# Patient Record
Sex: Female | Born: 1945 | Race: White | Hispanic: No | Marital: Married | Smoking: Former smoker
Health system: Southern US, Community
[De-identification: ages and names within clinical notes are randomized; demographics above are authoritative.]

## PROBLEM LIST (undated history)

## (undated) DIAGNOSIS — R87619 Unspecified abnormal cytological findings in specimens from cervix uteri: Secondary | ICD-10-CM

## (undated) DIAGNOSIS — E785 Hyperlipidemia, unspecified: Secondary | ICD-10-CM

## (undated) DIAGNOSIS — Z8619 Personal history of other infectious and parasitic diseases: Secondary | ICD-10-CM

## (undated) DIAGNOSIS — R002 Palpitations: Secondary | ICD-10-CM

## (undated) HISTORY — DX: Palpitations: R00.2

## (undated) HISTORY — PX: EYE SURGERY: SHX253

## (undated) HISTORY — DX: Hyperlipidemia, unspecified: E78.5

## (undated) HISTORY — PX: TONSILLECTOMY: SHX5217

## (undated) HISTORY — DX: Personal history of other infectious and parasitic diseases: Z86.19

## (undated) HISTORY — PX: PARATHYROID EXPLORATION: SHX732

## (undated) HISTORY — PX: CERVICAL BIOPSY  W/ LOOP ELECTRODE EXCISION: SUR135

## (undated) HISTORY — DX: Unspecified abnormal cytological findings in specimens from cervix uteri: R87.619

---

## 1995-07-12 HISTORY — PX: HERNIA REPAIR: SHX51

## 1999-05-31 ENCOUNTER — Encounter: Admission: RE | Admit: 1999-05-31 | Discharge: 1999-05-31 | Payer: Self-pay | Admitting: *Deleted

## 1999-07-13 ENCOUNTER — Other Ambulatory Visit: Admission: RE | Admit: 1999-07-13 | Discharge: 1999-07-13 | Payer: Self-pay | Admitting: *Deleted

## 2000-05-31 ENCOUNTER — Encounter: Admission: RE | Admit: 2000-05-31 | Discharge: 2000-05-31 | Payer: Self-pay | Admitting: *Deleted

## 2000-07-20 ENCOUNTER — Other Ambulatory Visit: Admission: RE | Admit: 2000-07-20 | Discharge: 2000-07-20 | Payer: Self-pay | Admitting: *Deleted

## 2001-06-01 ENCOUNTER — Encounter: Admission: RE | Admit: 2001-06-01 | Discharge: 2001-06-01 | Payer: Self-pay | Admitting: *Deleted

## 2001-08-02 ENCOUNTER — Other Ambulatory Visit: Admission: RE | Admit: 2001-08-02 | Discharge: 2001-08-02 | Payer: Self-pay | Admitting: *Deleted

## 2002-06-03 ENCOUNTER — Encounter: Admission: RE | Admit: 2002-06-03 | Discharge: 2002-06-03 | Payer: Self-pay | Admitting: *Deleted

## 2002-06-03 ENCOUNTER — Encounter: Payer: Self-pay | Admitting: *Deleted

## 2002-08-13 ENCOUNTER — Other Ambulatory Visit: Admission: RE | Admit: 2002-08-13 | Discharge: 2002-08-13 | Payer: Self-pay | Admitting: *Deleted

## 2003-06-06 ENCOUNTER — Encounter: Admission: RE | Admit: 2003-06-06 | Discharge: 2003-06-06 | Payer: Self-pay | Admitting: *Deleted

## 2003-08-07 ENCOUNTER — Other Ambulatory Visit: Admission: RE | Admit: 2003-08-07 | Discharge: 2003-08-07 | Payer: Self-pay | Admitting: *Deleted

## 2004-05-07 ENCOUNTER — Encounter: Admission: RE | Admit: 2004-05-07 | Discharge: 2004-05-07 | Payer: Self-pay | Admitting: *Deleted

## 2005-05-06 ENCOUNTER — Encounter: Admission: RE | Admit: 2005-05-06 | Discharge: 2005-05-06 | Payer: Self-pay | Admitting: *Deleted

## 2006-05-05 ENCOUNTER — Encounter: Admission: RE | Admit: 2006-05-05 | Discharge: 2006-05-05 | Payer: Self-pay | Admitting: *Deleted

## 2007-05-08 ENCOUNTER — Encounter: Admission: RE | Admit: 2007-05-08 | Discharge: 2007-05-08 | Payer: Self-pay | Admitting: Obstetrics and Gynecology

## 2008-05-07 ENCOUNTER — Encounter: Admission: RE | Admit: 2008-05-07 | Discharge: 2008-05-07 | Payer: Self-pay | Admitting: Obstetrics and Gynecology

## 2009-04-28 ENCOUNTER — Ambulatory Visit: Payer: Self-pay | Admitting: Diagnostic Radiology

## 2009-04-28 ENCOUNTER — Ambulatory Visit (HOSPITAL_BASED_OUTPATIENT_CLINIC_OR_DEPARTMENT_OTHER): Admission: RE | Admit: 2009-04-28 | Discharge: 2009-04-28 | Payer: Self-pay | Admitting: Family Medicine

## 2010-05-04 ENCOUNTER — Ambulatory Visit: Payer: Self-pay | Admitting: Radiology

## 2010-05-04 ENCOUNTER — Ambulatory Visit (HOSPITAL_BASED_OUTPATIENT_CLINIC_OR_DEPARTMENT_OTHER): Admission: RE | Admit: 2010-05-04 | Discharge: 2010-05-04 | Payer: Self-pay | Admitting: Obstetrics and Gynecology

## 2010-08-01 ENCOUNTER — Encounter: Payer: Self-pay | Admitting: Family Medicine

## 2011-03-10 ENCOUNTER — Other Ambulatory Visit (HOSPITAL_BASED_OUTPATIENT_CLINIC_OR_DEPARTMENT_OTHER): Payer: Self-pay | Admitting: Obstetrics & Gynecology

## 2011-03-10 DIAGNOSIS — Z1231 Encounter for screening mammogram for malignant neoplasm of breast: Secondary | ICD-10-CM

## 2011-05-05 ENCOUNTER — Telehealth: Payer: Self-pay | Admitting: Internal Medicine

## 2011-05-05 ENCOUNTER — Ambulatory Visit (HOSPITAL_BASED_OUTPATIENT_CLINIC_OR_DEPARTMENT_OTHER)
Admission: RE | Admit: 2011-05-05 | Discharge: 2011-05-05 | Disposition: A | Discharge status: Home / Self Care | Payer: Federal, State, Local not specified - PPO | Source: Ambulatory Visit | Attending: Obstetrics & Gynecology | Admitting: Obstetrics & Gynecology

## 2011-05-05 DIAGNOSIS — Z1231 Encounter for screening mammogram for malignant neoplasm of breast: Secondary | ICD-10-CM | POA: Insufficient documentation

## 2011-05-05 NOTE — Telephone Encounter (Signed)
Stephanie Turner is someone that is looking to becoming a new patient of Dr. Constance Goltz. She is not due to have an pap until January and would like to have that done in January. She has questions on what all do Dr, Constance Goltz does on her first visit and if she can just wait possibility to get everything done in January (I think I understood her correctly I could be wrong on this one). I advise her that since she have a lot of questions that Dr Lonell Face nurse Gavin Pound would give her a call and break it down better to her of what she does on her first and second visits. Then she can decide what she would like to do from there. Her house number is  [phone] and her cell [phone]. Nalani works Monday thru Fri 8- 130 pm; its better to call after 130 pm.   Thanks so much in advance for calling Mckala back

## 2011-05-09 NOTE — Telephone Encounter (Signed)
LMOVM for pt to return call to the office

## 2011-05-11 NOTE — Telephone Encounter (Signed)
Spoke with patient.  Answered questions, scheduled both establish care appointment and CPE appointment

## 2011-05-11 NOTE — Telephone Encounter (Signed)
Pt is calling again stating she have not recd a call; she was inform that she was called and a message was left; she wants to ask questions and she wants to know about results of a mammogram; please call at home [phone]

## 2011-07-13 ENCOUNTER — Telehealth: Payer: Self-pay | Admitting: Internal Medicine

## 2011-07-13 NOTE — Telephone Encounter (Signed)
Pt have questions on what her blood work is actually testing ; and possible other questions; please call [phone], if you are not able to get on the land line; thanks

## 2011-07-14 NOTE — Telephone Encounter (Signed)
LMOVM for pt to return call to the office

## 2011-07-19 NOTE — Telephone Encounter (Signed)
Spoke with [name], answered questions regarding visit and potential labs

## 2011-07-20 ENCOUNTER — Other Ambulatory Visit: Payer: Self-pay | Admitting: Internal Medicine

## 2011-07-20 ENCOUNTER — Ambulatory Visit (INDEPENDENT_AMBULATORY_CARE_PROVIDER_SITE_OTHER): Payer: Federal, State, Local not specified - PPO | Admitting: Internal Medicine

## 2011-07-20 ENCOUNTER — Encounter: Payer: Self-pay | Admitting: Internal Medicine

## 2011-07-20 VITALS — BP 136/70 | HR 71 | Temp 97.6°F | Resp 16 | Ht 59.75 in | Wt 200.0 lb

## 2011-07-20 DIAGNOSIS — M810 Age-related osteoporosis without current pathological fracture: Secondary | ICD-10-CM | POA: Insufficient documentation

## 2011-07-20 DIAGNOSIS — Z113 Encounter for screening for infections with a predominantly sexual mode of transmission: Secondary | ICD-10-CM

## 2011-07-20 DIAGNOSIS — R002 Palpitations: Secondary | ICD-10-CM | POA: Insufficient documentation

## 2011-07-20 DIAGNOSIS — R87619 Unspecified abnormal cytological findings in specimens from cervix uteri: Secondary | ICD-10-CM | POA: Insufficient documentation

## 2011-07-20 DIAGNOSIS — E213 Hyperparathyroidism, unspecified: Secondary | ICD-10-CM | POA: Insufficient documentation

## 2011-07-20 DIAGNOSIS — Z139 Encounter for screening, unspecified: Secondary | ICD-10-CM

## 2011-07-20 DIAGNOSIS — Z01419 Encounter for gynecological examination (general) (routine) without abnormal findings: Secondary | ICD-10-CM

## 2011-07-20 DIAGNOSIS — H269 Unspecified cataract: Secondary | ICD-10-CM | POA: Insufficient documentation

## 2011-07-20 DIAGNOSIS — Z1272 Encounter for screening for malignant neoplasm of vagina: Secondary | ICD-10-CM

## 2011-07-20 LAB — POCT URINALYSIS DIPSTICK
Blood, UA: NEGATIVE
Glucose, UA: NEGATIVE
Nitrite, UA: NEGATIVE
Protein, UA: NEGATIVE
Urobilinogen, UA: NEGATIVE

## 2011-07-20 NOTE — Progress Notes (Signed)
Subjective:    Patient ID: Stephanie Turner, female    DOB: Apr 22, 1946, 66 y.o.   MRN: [id number]  HPI New pt here for first visit and comprehensive exam.  Former care Dr. Rosalio Macadamia and has seen an endocrine surgeon in past.  PMH of palpitations, Hypercalcemia due to hyperparathyroidism per pt report,  Osteoporosis formerly on Fosamax now off, post menopausal   And cataracts.  I have no prior records.  She overall is doing well.  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  No breast, uterine, ovarian or colon CA in family  No Known Allergies Past Medical History  Diagnosis Date  . Palpitations   . Abnormal cervical Papanicolaou smear   . Hyperparathyroidism   . Cataract   . Osteoporosis    Past Surgical History  Procedure Date  . Hernia repair 1997   History   Social History  . Marital Status: Married    Spouse Name: N/A    Number of Children: N/A  . Years of Education: N/A   Occupational History  . Not on file.   Social History Main Topics  . Smoking status: Former Smoker    Quit date: 07/11/1984  . Smokeless tobacco: Never Used  . Alcohol Use: No  . Drug Use: No  . Sexually Active: Yes    Birth Control/ Protection: Post-menopausal   Other Topics Concern  . Not on file   Social History Narrative  . No narrative on file   Family History  Problem Relation Age of Onset  . Cancer Mother     leukemia   Patient Active Problem List  Diagnoses  . Abnormal cervical Papanicolaou smear  . Palpitations  . Hyperparathyroidism  . Cataract  . Osteoporosis  . Hypercalcemia   No current outpatient prescriptions on file prior to visit.       Review of Systems Review of all  9 body system is negative      Objective:   Physical Exam  Physical Exam  Vital signs and nursing note  reviewed  Constitutional: She is oriented to person, place, and time. She appears well-developed and well-nourished. She is cooperative.  HENT:  Head: Normocephalic and atraumatic.  Right Ear: Tympanic membrane normal.  Left Ear: Tympanic membrane normal.  Nose: Nose normal.  Mouth/Throat: Oropharynx is clear and moist and mucous membranes are normal. No oropharyngeal exudate or posterior oropharyngeal erythema.  Eyes: Conjunctivae and EOM are normal. Pupils are equal, round, and reactive to light.  Neck: Neck supple. No JVD present. Carotid bruit is not present. No mass and no thyromegaly present.  Cardiovascular: Regular rhythm, normal heart sounds, intact distal pulses and normal pulses.  Exam reveals no gallop and no friction rub.   No murmur heard. Pulses:      Dorsalis pedis pulses are 2+ on the right side, and 2+ on the left side.  Pulmonary/Chest: Breath sounds normal. She has no wheezes. She has no rhonchi. She has no rales. Right breast exhibits no mass, no nipple discharge and no skin change. Left breast exhibits no mass, no nipple discharge and no skin change.  Abdominal: Soft. Bowel sounds are normal. She exhibits no distension and no mass. There is no hepatosplenomegaly. There is no tenderness. There is no CVA tenderness.  Genitourinary: Rectum normal, vagina normal and uterus normal.  Rectal exam shows no mass. Guaiac negative stool. No labial fusion. There is no lesion on the right labia. There is no lesion on the left labia. Cervix exhibits no motion tenderness. Right adnexum displays no mass, no tenderness and no fullness. Left adnexum displays no mass, no tenderness and no fullness. No erythema around the vagina.  Musculoskeletal:       No active synovitis to any joint.    Lymphadenopathy:       Right cervical: No superficial cervical adenopathy present.      Left cervical: No superficial cervical adenopathy present.       Right axillary: No pectoral and no lateral adenopathy  present.       Left axillary: No pectoral and no lateral adenopathy present.      Right: No inguinal adenopathy present.       Left: No inguinal adenopathy present.  Neurological: She is alert and oriented to person, place, and time. She has normal strength and normal reflexes. No cranial nerve deficit or sensory deficit. She displays a negative Romberg sign. Coordination and gait normal.  Skin: Skin is warm and dry. No abrasion, no bruising, no ecchymosis and no rash noted. No cyanosis. Nails show no clubbing.  Psychiatric: She has a normal mood and affect. Her speech is normal and behavior is normal.          Assessment & Plan:  1)  palpitaitons  Well controlled with Atenolol   Will need old records  Check chemistries , lipids 2)  Past osteoporosis  Off Bisphosphanates now  Will schedule bone density  3)  Hypercalcemia and hyperparathyroidism S/P  Parathyroidectomy  All per pt report will need old records  Check calcium TSH today  See scanned record   I spent 45 minutes with this pt.  Pap done today        Assessment & Plan:

## 2011-07-20 NOTE — Patient Instructions (Addendum)
Labs will be mailed to you  See me in 6 months or as needed   I recommend a colonsocopy  We will schedule a bone density

## 2011-07-21 ENCOUNTER — Encounter: Payer: Self-pay | Admitting: Emergency Medicine

## 2011-07-21 LAB — CBC WITH DIFFERENTIAL/PLATELET
Basophils Absolute: 0 10*3/uL (ref 0.0–0.1)
Eosinophils Absolute: 0.1 10*3/uL (ref 0.0–0.7)
Eosinophils Relative: 2 % (ref 0–5)
Lymphs Abs: 2.8 10*3/uL (ref 0.7–4.0)
MCH: 29.7 pg (ref 26.0–34.0)
Neutrophils Relative %: 49 % (ref 43–77)
Platelets: 239 10*3/uL (ref 150–400)
RBC: 4.68 MIL/uL (ref 3.87–5.11)
RDW: 14.6 % (ref 11.5–15.5)
WBC: 6.8 10*3/uL (ref 4.0–10.5)

## 2011-07-21 LAB — LIPID PANEL
Cholesterol: 207 mg/dL — ABNORMAL HIGH (ref 0–200)
LDL Cholesterol: 113 mg/dL — ABNORMAL HIGH (ref 0–99)
Total CHOL/HDL Ratio: 2.8 Ratio
VLDL: 21 mg/dL (ref 0–40)

## 2011-07-21 LAB — COMPREHENSIVE METABOLIC PANEL
ALT: 17 U/L (ref 0–35)
AST: 21 U/L (ref 0–37)
Alkaline Phosphatase: 95 U/L (ref 39–117)
CO2: 24 mEq/L (ref 19–32)
Creat: 0.81 mg/dL (ref 0.50–1.10)
Sodium: 140 mEq/L (ref 135–145)
Total Bilirubin: 0.5 mg/dL (ref 0.3–1.2)
Total Protein: 7.3 g/dL (ref 6.0–8.3)

## 2011-07-21 LAB — VITAMIN D 25 HYDROXY (VIT D DEFICIENCY, FRACTURES): Vit D, 25-Hydroxy: 39 ng/mL (ref 30–89)

## 2011-07-21 LAB — TSH: TSH: 1.063 u[IU]/mL (ref 0.350–4.500)

## 2011-07-27 ENCOUNTER — Telehealth: Payer: Self-pay | Admitting: Internal Medicine

## 2011-07-27 NOTE — Telephone Encounter (Signed)
Spoke with [name].  Reviewed labs, explained she will receive a copy in the mail.

## 2011-07-27 NOTE — Telephone Encounter (Signed)
Pt states she have not receive a call about blood work results. Please call her at [phone] ,and if you have call her in the morning (830am -130pm) call her cell number. Thanks

## 2011-07-28 ENCOUNTER — Telehealth: Payer: Self-pay | Admitting: Emergency Medicine

## 2011-07-28 ENCOUNTER — Encounter: Payer: Self-pay | Admitting: Internal Medicine

## 2011-07-28 DIAGNOSIS — D351 Benign neoplasm of parathyroid gland: Secondary | ICD-10-CM | POA: Insufficient documentation

## 2011-07-28 NOTE — Telephone Encounter (Signed)
Received her prior records with normal paps in past.,  Can have every two years

## 2011-07-28 NOTE — Telephone Encounter (Signed)
Spoke with Stephanie Turner.  She would like to know at what age she can begin to have pap smears less often than every year?  She was told by her Ob/gyn previously that at 45, she could have one done every other year or even every 2 years.  Your recommendation on her result was for a repeat in a year.

## 2011-08-01 ENCOUNTER — Ambulatory Visit: Payer: Federal, State, Local not specified - PPO | Admitting: Internal Medicine

## 2011-08-01 ENCOUNTER — Other Ambulatory Visit: Payer: Self-pay | Admitting: Internal Medicine

## 2011-08-01 NOTE — Telephone Encounter (Signed)
Aware pap needed every 2 years.  She also mentioned that she had bone density done this morning and would like me to remind you that she was on Fosamax for years which was discontinued by Dr. Corrinne Eagle 3-4 years ago after an improvement in her bone density.  She would like a letter reminding her when she is due again for bone density and what her bone density showed mailed to her for her own record keeping.

## 2011-08-01 NOTE — Telephone Encounter (Signed)
Left message on voice mail for Lasya to call back at her convenience

## 2011-08-02 ENCOUNTER — Inpatient Hospital Stay: Admission: RE | Admit: 2011-08-02 | Payer: Federal, State, Local not specified - PPO | Source: Ambulatory Visit

## 2011-08-02 NOTE — Telephone Encounter (Signed)
Ok to mail a copy of results to pt.    Call her and let her know that when coming off Fosamax , standard care is to check a bone density yearly when off the medication, however Medicare may only pay for her test every two years.   She should check with Medicare to see if it is paid for once a year when she has had osteoporosis in past and is now off her medication.

## 2011-08-03 NOTE — Telephone Encounter (Signed)
Left message with Cortnee's husband for her to call the office

## 2011-08-03 NOTE — Telephone Encounter (Signed)
Stephanie Turner aware of results, will check with her insurance carrier to see what her coverage is with history of osteoporosis.  Mailed a copy of results to her home address

## 2011-08-05 ENCOUNTER — Encounter: Payer: Self-pay | Admitting: Internal Medicine

## 2011-08-10 ENCOUNTER — Telehealth: Payer: Self-pay | Admitting: Internal Medicine

## 2011-08-10 NOTE — Telephone Encounter (Signed)
Pt would like a return call; she needs to discuss her bone density test with someone. Please call her at [phone]. Thanks

## 2011-08-10 NOTE — Telephone Encounter (Signed)
Let pt know that I looked at her last bone density  she has very minimal change from her prior one  and her bone density is very good for her age.  She is to take her calcium and Vitamin D daily and do weight bearing exercise.

## 2011-08-10 NOTE — Telephone Encounter (Signed)
Spoke with Stephanie Turner.  She would like to know if [facility] compared her bone density scan to her last bone density she had done.  She would also like a detailed explanation.  For instance, she knows the scan was "good", but does that mean "good for my age or what?"

## 2011-08-11 NOTE — Telephone Encounter (Signed)
Left message with pt's husband for her to return call to the office

## 2011-08-22 ENCOUNTER — Other Ambulatory Visit: Payer: Self-pay | Admitting: Emergency Medicine

## 2011-08-22 NOTE — Telephone Encounter (Signed)
Vida called, requested refill of Atenolol, 90 day supply, to be sent to [facility]

## 2011-08-22 NOTE — Telephone Encounter (Signed)
Pt aware.

## 2011-08-23 MED ORDER — ATENOLOL 50 MG PO TABS: 50.0000 mg | ORAL_TABLET | Freq: Every day | ORAL | Status: DC

## 2011-08-26 ENCOUNTER — Encounter: Payer: Self-pay | Admitting: Internal Medicine

## 2012-02-15 ENCOUNTER — Other Ambulatory Visit: Payer: Self-pay | Admitting: Internal Medicine

## 2012-02-15 DIAGNOSIS — Z1231 Encounter for screening mammogram for malignant neoplasm of breast: Secondary | ICD-10-CM

## 2012-05-07 ENCOUNTER — Ambulatory Visit (HOSPITAL_BASED_OUTPATIENT_CLINIC_OR_DEPARTMENT_OTHER): Payer: Federal, State, Local not specified - PPO

## 2012-05-07 ENCOUNTER — Ambulatory Visit (HOSPITAL_BASED_OUTPATIENT_CLINIC_OR_DEPARTMENT_OTHER)
Admission: RE | Admit: 2012-05-07 | Discharge: 2012-05-07 | Disposition: A | Discharge status: Home / Self Care | Payer: Federal, State, Local not specified - PPO | Source: Ambulatory Visit | Attending: Internal Medicine | Admitting: Internal Medicine

## 2012-05-07 DIAGNOSIS — Z1231 Encounter for screening mammogram for malignant neoplasm of breast: Secondary | ICD-10-CM | POA: Insufficient documentation

## 2012-05-08 ENCOUNTER — Ambulatory Visit (HOSPITAL_BASED_OUTPATIENT_CLINIC_OR_DEPARTMENT_OTHER): Payer: Federal, State, Local not specified - PPO

## 2012-05-17 ENCOUNTER — Telehealth: Payer: Self-pay | Admitting: Internal Medicine

## 2012-05-17 NOTE — Telephone Encounter (Signed)
Pt will like to know if an fax came over about her mammogram... She would like to know more info about BILATERAL mammogram next year.. Please call pt at [phone].... Thanks

## 2012-05-22 NOTE — Telephone Encounter (Signed)
Left message awaiting return call

## 2012-05-30 ENCOUNTER — Other Ambulatory Visit: Payer: Self-pay | Admitting: *Deleted

## 2012-05-30 NOTE — Telephone Encounter (Signed)
Needs refill

## 2012-05-31 MED ORDER — ATENOLOL 50 MG PO TABS: 50.0000 mg | ORAL_TABLET | Freq: Every day | ORAL | Status: DC

## 2012-07-12 ENCOUNTER — Telehealth: Payer: Self-pay | Admitting: *Deleted

## 2012-07-12 DIAGNOSIS — Z0000 Encounter for general adult medical examination without abnormal findings: Secondary | ICD-10-CM

## 2012-07-12 NOTE — Telephone Encounter (Signed)
Called regarding lab experience left message

## 2012-07-20 ENCOUNTER — Telehealth: Payer: Self-pay | Admitting: *Deleted

## 2012-07-20 DIAGNOSIS — Z139 Encounter for screening, unspecified: Secondary | ICD-10-CM

## 2012-07-20 LAB — CBC WITH DIFFERENTIAL/PLATELET
Basophils Absolute: 0 10*3/uL (ref 0.0–0.1)
Basophils Relative: 0 % (ref 0–1)
HCT: 43.1 % (ref 36.0–46.0)
Lymphocytes Relative: 38 % (ref 12–46)
MCHC: 33.4 g/dL (ref 30.0–36.0)
Monocytes Absolute: 0.5 10*3/uL (ref 0.1–1.0)
Neutro Abs: 3.3 10*3/uL (ref 1.7–7.7)
Neutrophils Relative %: 51 % (ref 43–77)
Platelets: 221 10*3/uL (ref 150–400)
RDW: 14 % (ref 11.5–15.5)
WBC: 6.5 10*3/uL (ref 4.0–10.5)

## 2012-07-20 LAB — COMPREHENSIVE METABOLIC PANEL WITH GFR
ALT: 17 U/L (ref 0–35)
AST: 18 U/L (ref 0–37)
Albumin: 4.4 g/dL (ref 3.5–5.2)
Alkaline Phosphatase: 74 U/L (ref 39–117)
BUN: 16 mg/dL (ref 6–23)
CO2: 27 meq/L (ref 19–32)
Calcium: 9.4 mg/dL (ref 8.4–10.5)
Chloride: 104 meq/L (ref 96–112)
Creat: 0.73 mg/dL (ref 0.50–1.10)
Glucose, Bld: 92 mg/dL (ref 70–99)
Potassium: 4.6 meq/L (ref 3.5–5.3)
Sodium: 140 meq/L (ref 135–145)
Total Bilirubin: 0.7 mg/dL (ref 0.3–1.2)
Total Protein: 6.7 g/dL (ref 6.0–8.3)

## 2012-07-20 LAB — LIPID PANEL
Cholesterol: 188 mg/dL (ref 0–200)
HDL: 65 mg/dL
LDL Cholesterol: 104 mg/dL — ABNORMAL HIGH (ref 0–99)
Total CHOL/HDL Ratio: 2.9 ratio
Triglycerides: 95 mg/dL
VLDL: 19 mg/dL (ref 0–40)

## 2012-07-20 LAB — TSH: TSH: 1.438 u[IU]/mL (ref 0.350–4.500)

## 2012-07-23 ENCOUNTER — Ambulatory Visit (INDEPENDENT_AMBULATORY_CARE_PROVIDER_SITE_OTHER): Payer: Federal, State, Local not specified - PPO | Admitting: Internal Medicine

## 2012-07-23 ENCOUNTER — Encounter: Payer: Self-pay | Admitting: Internal Medicine

## 2012-07-23 ENCOUNTER — Telehealth: Payer: Self-pay | Admitting: Internal Medicine

## 2012-07-23 ENCOUNTER — Other Ambulatory Visit: Payer: Self-pay | Admitting: Internal Medicine

## 2012-07-23 VITALS — BP 138/88 | HR 76 | Temp 97.3°F | Resp 18 | Wt 196.1 lb

## 2012-07-23 DIAGNOSIS — Z139 Encounter for screening, unspecified: Secondary | ICD-10-CM

## 2012-07-23 DIAGNOSIS — R002 Palpitations: Secondary | ICD-10-CM

## 2012-07-23 DIAGNOSIS — D351 Benign neoplasm of parathyroid gland: Secondary | ICD-10-CM

## 2012-07-23 DIAGNOSIS — Z1151 Encounter for screening for human papillomavirus (HPV): Secondary | ICD-10-CM

## 2012-07-23 DIAGNOSIS — M810 Age-related osteoporosis without current pathological fracture: Secondary | ICD-10-CM

## 2012-07-23 DIAGNOSIS — R87619 Unspecified abnormal cytological findings in specimens from cervix uteri: Secondary | ICD-10-CM

## 2012-07-23 LAB — POCT URINALYSIS DIPSTICK
Blood, UA: NEGATIVE
Nitrite, UA: NEGATIVE
Protein, UA: NEGATIVE
Spec Grav, UA: >=1.03
Urobilinogen, UA: NEGATIVE

## 2012-07-23 LAB — HEMOCCULT GUIAC POC 1CARD (OFFICE)

## 2012-07-23 MED ORDER — ATENOLOL 50 MG PO TABS: 50.0000 mg | ORAL_TABLET | Freq: Every day | ORAL | Status: DC

## 2012-07-23 NOTE — Addendum Note (Signed)
Addended by: Mathews Robinsons on: 07/23/2012 12:29 PM   Modules accepted: Orders

## 2012-07-23 NOTE — Progress Notes (Signed)
Subjective:    Patient ID: Stephanie Turner, female    DOB: 1946-01-13, 67 y.o.   MRN: [id number]  HPI  Stephanie Turner is here for CPE.  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  She now cares for her 2 grandchildren.   HTN:  She is on Tenormin daily and tolerating well.    Osteoporosis:  Last dexa done 07/2011 and normal.  She is on Calcium and Vitamin D  HIstory of cervical dysplasia  Last pap normal.  She is S/P cryosurgery   No Known Allergies Past Medical History  Diagnosis Date  . Palpitations   . Abnormal cervical Papanicolaou smear   . Hyperparathyroidism   . Cataract   . Osteoporosis    Past Surgical History  Procedure Date  . Hernia repair 1997   History   Social History  . Marital Status: Married    Spouse Name: N/A    Number of Children: N/A  . Years of Education: N/A   Occupational History  . Not on file.   Social History Main Topics  . Smoking status: Former Smoker    Quit date: 07/11/1984  . Smokeless tobacco: Never Used  . Alcohol Use: No  . Drug Use: No  . Sexually Active: Yes    Birth Control/ Protection: Post-menopausal   Other Topics Concern  . Not on file   Social History Narrative  . No narrative on file   Family History  Problem Relation Age of Onset  . Cancer Mother     leukemia   Patient Active Problem List  Diagnosis  . Abnormal cervical Papanicolaou smear  . Palpitations  . Hyperparathyroidism  . Cataract  . Osteoporosis  . Hypercalcemia  . Parathyroid adenoma   Current Outpatient Prescriptions on File Prior to Visit  Medication Sig Dispense Refill  . atenolol (TENORMIN) 50 MG tablet Take 1 tablet (50 mg total) by mouth daily.  90 tablet  3  . fish oil-omega-3 fatty acids 1000 MG capsule Take 1 capsule by mouth daily.      Marland Kitchen GARLIC PO Take 1 tablet by mouth daily.      . Multiple Vitamin (MULTIVITAMIN) tablet Take 1 tablet by mouth daily.      Marland Kitchen ROYAL JELLY PO Take 1 tablet by mouth daily.          Review of Systems See  HPI    Objective:   Physical Exam Physical Exam  Vital signs and nursing note reviewed  Constitutional: She is oriented to person, place, and time. She appears well-developed and well-nourished. She is cooperative.  HENT:  Head: Normocephalic and atraumatic.  Right Ear: Tympanic membrane normal.  Left Ear: Tympanic membrane normal.  Nose: Nose normal.  Mouth/Throat: Oropharynx is clear and moist and mucous membranes are normal. No oropharyngeal exudate or posterior oropharyngeal erythema.  Eyes: Conjunctivae and EOM are normal. Pupils are equal, round, and reactive to light.  Neck: Neck supple. No JVD present. Carotid bruit is not present. No mass and no thyromegaly present.  Cardiovascular: Regular rhythm, normal heart sounds, intact distal pulses and normal pulses.  Exam reveals no gallop and no friction rub.   No murmur heard. Pulses:      Dorsalis pedis pulses are 2+ on the right side, and 2+ on the left side.  Pulmonary/Chest: Breath sounds normal. She has no wheezes. She has no rhonchi. She has no rales. Right breast exhibits no mass, no nipple discharge and no skin change. Left breast exhibits no mass,  no nipple discharge and no skin change.  Abdominal: Soft. Bowel sounds are normal. She exhibits no distension and no mass. There is no hepatosplenomegaly. There is no tenderness. There is no CVA tenderness.  Genitourinary: Rectum normal, vagina normal and uterus normal. Rectal exam shows no mass. Guaiac negative stool. No labial fusion. There is no lesion on the right labia. There is no lesion on the left labia. Cervix exhibits no motion tenderness. Right adnexum displays no mass, no tenderness and no fullness. Left adnexum displays no mass, no tenderness and no fullness. No erythema around the vagina.  Musculoskeletal:       No active synovitis to any joint.    Lymphadenopathy:       Right cervical: No superficial cervical adenopathy present.      Left cervical: No superficial  cervical adenopathy present.       Right axillary: No pectoral and no lateral adenopathy present.       Left axillary: No pectoral and no lateral adenopathy present.      Right: No inguinal adenopathy present.       Left: No inguinal adenopathy present.  Neurological: She is alert and oriented to person, place, and time. She has normal strength and normal reflexes. No cranial nerve deficit or sensory deficit. She displays a negative Romberg sign. Coordination and gait normal.  Skin: Skin is warm and dry. No abrasion, no bruising, no ecchymosis and no rash noted. No cyanosis. Nails show no clubbing.  Psychiatric: She has a normal mood and affect. Her speech is normal and behavior is normal.          Assessment & Plan:  Health Maintenance  :  See scanned sheet. She is going to check her [employer] records as she believes she is UTD with tetanus.  Pap today.  She repeatedly denies colonoscopy despite my advice it is necessary  History of cervical dysplasia  Palpitations  On Tenormin  History of osteoporosis  2013 dexa normal  On calcium and vitamin D  S/P parathyroidectomy calcium normal   See me as needed        Assessment & Plan:

## 2012-07-23 NOTE — Telephone Encounter (Signed)
Left message to return call 

## 2012-07-23 NOTE — Addendum Note (Signed)
Addended by: Mathews Robinsons on: 07/23/2012 12:32 PM   Modules accepted: Orders

## 2012-07-23 NOTE — Telephone Encounter (Signed)
Pt called and request to have a call back from you .Marland Kitchen Please call pt at [phone]

## 2012-07-26 NOTE — Telephone Encounter (Signed)
Pt will come in for redraw of labs

## 2012-07-30 ENCOUNTER — Telehealth: Payer: Self-pay | Admitting: *Deleted

## 2012-07-30 NOTE — Telephone Encounter (Signed)
-   pap letter sent

## 2012-08-06 ENCOUNTER — Telehealth: Payer: Self-pay | Admitting: Internal Medicine

## 2012-08-06 ENCOUNTER — Encounter: Payer: Self-pay | Admitting: *Deleted

## 2012-08-06 NOTE — Telephone Encounter (Signed)
Pt states she has not receive her results of her pap smear in the mail.. She states she was told that she was going to receive them and states it should not take this long.. Per pt she was told the results over the phone but she needs to see them.. Please call pt per pt  On [phone] or [phone].Marland Kitchen

## 2013-02-25 ENCOUNTER — Other Ambulatory Visit: Payer: Self-pay | Admitting: Internal Medicine

## 2013-02-25 DIAGNOSIS — Z1231 Encounter for screening mammogram for malignant neoplasm of breast: Secondary | ICD-10-CM

## 2013-05-01 ENCOUNTER — Ambulatory Visit (HOSPITAL_BASED_OUTPATIENT_CLINIC_OR_DEPARTMENT_OTHER)
Admission: RE | Admit: 2013-05-01 | Discharge: 2013-05-01 | Disposition: A | Discharge status: Home / Self Care | Payer: Federal, State, Local not specified - PPO | Source: Ambulatory Visit | Attending: Internal Medicine | Admitting: Internal Medicine

## 2013-05-01 ENCOUNTER — Ambulatory Visit (INDEPENDENT_AMBULATORY_CARE_PROVIDER_SITE_OTHER): Payer: Federal, State, Local not specified - PPO | Admitting: Internal Medicine

## 2013-05-01 ENCOUNTER — Other Ambulatory Visit: Payer: Self-pay | Admitting: Internal Medicine

## 2013-05-01 ENCOUNTER — Encounter: Payer: Self-pay | Admitting: Internal Medicine

## 2013-05-01 ENCOUNTER — Telehealth: Payer: Self-pay | Admitting: *Deleted

## 2013-05-01 VITALS — BP 135/85 | HR 64 | Temp 97.0°F | Resp 18 | Wt 196.0 lb

## 2013-05-01 DIAGNOSIS — M25569 Pain in unspecified knee: Secondary | ICD-10-CM | POA: Insufficient documentation

## 2013-05-01 DIAGNOSIS — I8390 Asymptomatic varicose veins of unspecified lower extremity: Secondary | ICD-10-CM

## 2013-05-01 DIAGNOSIS — M51379 Other intervertebral disc degeneration, lumbosacral region without mention of lumbar back pain or lower extremity pain: Secondary | ICD-10-CM | POA: Insufficient documentation

## 2013-05-01 DIAGNOSIS — M5137 Other intervertebral disc degeneration, lumbosacral region: Secondary | ICD-10-CM | POA: Insufficient documentation

## 2013-05-01 DIAGNOSIS — M25559 Pain in unspecified hip: Secondary | ICD-10-CM | POA: Insufficient documentation

## 2013-05-01 DIAGNOSIS — M2550 Pain in unspecified joint: Secondary | ICD-10-CM

## 2013-05-01 DIAGNOSIS — M1990 Unspecified osteoarthritis, unspecified site: Secondary | ICD-10-CM

## 2013-05-01 MED ORDER — NABUMETONE 500 MG PO TABS: ORAL_TABLET | ORAL | Status: DC

## 2013-05-01 NOTE — Telephone Encounter (Signed)
Called Stephanie Turner and let her know the results of her x-rays.  She voiced understanding and said she will take the medication prescribed and see how it helps before calling the Vein clinic or Dr Charlett Blake.  She will let us know at her follow up appt how she is doing and if she wishes to have the referral.

## 2013-05-01 NOTE — Telephone Encounter (Signed)
Message copied by Malena Catholic on Wed May 01, 2013  1:53 PM ------      Message from: Raechel Chute D      Created: Wed May 01, 2013  1:39 PM       Stephanie Turner             Call [name] and let her know that both knees show mild arthritis            Her left hip did not show arthritis but she may have bursitis in her hip            Ok to refer to Dr. Lunette Stands if she wishes            Advise her to call [facility] as well as her varicose veins also are contributing to her achy discomfort ------

## 2013-05-01 NOTE — Patient Instructions (Signed)
Give number to pt of [facility]  [address]  Dr. Zachery Dauer  3rd floor  Corinda Gubler Pura Spice will be coming here in the spring  See me in  Office in 3 weeks  30 mins

## 2013-05-01 NOTE — Progress Notes (Signed)
Subjective:    Patient ID: Stephanie Turner, female    DOB: 15-Jun-1946, 67 y.o.   MRN: [id number]  HPI Stephanie Turner is here for acute visit .  She reports 3-4 month of achy pain in both knees and L hip.   Ibuprofen helps some.  Pain began when she was moving [employer] when her kitchen was remodeled back in June  No redness or edema to any joint.  She has know venous varicosities.  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 Palpitations   . Abnormal cervical Papanicolaou smear   . Hyperparathyroidism   . Cataract   . Osteoporosis    Past Surgical History  Procedure Laterality Date  . Hernia repair  1997   History   Social History  . Marital Status: Married    Spouse Name: N/A    Number of Children: N/A  . Years of Education: N/A   Occupational History  . Not on file.   Social History Main Topics  . Smoking status: Former Smoker    Quit date: 07/11/1984  . Smokeless tobacco: Never Used  . Alcohol Use: No  . Drug Use: No  . Sexual Activity: Yes    Birth Control/ Protection: Post-menopausal   Other Topics Concern  . Not on file   Social History Narrative  . No narrative on file   Family History  Problem Relation Age of Onset  . Cancer Mother     leukemia   Patient Active Problem List   Diagnosis Date Noted  . 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 Hypercalcemia 07/20/2011  . Abnormal cervical Papanicolaou smear   . Palpitations   . Hyperparathyroidism   . Cataract   . Osteoporosis    Current Outpatient Prescriptions on File Prior to Visit  Medication Sig Dispense Refill  . aspirin 81 MG tablet Take 81 mg by mouth daily.      Marland Kitchen atenolol (TENORMIN) 50 MG tablet Take 1 tablet (50 mg total) by mouth daily.  90 tablet  3  . fish oil-omega-3 fatty acids 1000 MG capsule Take 1 capsule by mouth daily.      Marland Kitchen GARLIC PO Take 1 tablet by mouth daily.      .  Multiple Vitamin (MULTIVITAMIN) tablet Take 1 tablet by mouth daily.      Marland Kitchen ROYAL JELLY PO Take 1 tablet by mouth daily.       No current facility-administered medications on file prior to visit.       Review of Systems See HPI    Objective:   Physical Exam Physical Exam  Nursing note and vitals reviewed.  Constitutional: She is oriented to person, place, and time. She appears well-developed and well-nourished.  HENT:  Head: Normocephalic and atraumatic.  Cardiovascular: Normal rate and regular rhythm. Exam reveals no gallop and no friction rub.  No murmur heard.  Pulmonary/Chest: Breath sounds normal. She has no wheezes. She has no rales.  Neurological: She is alert and oriented to person, place, and time.  Skin: Skin is warm and dry.  Ext  Extensive venous varicosities both legs.  Homans sign neg bilaterally Good bilateral pedal pulses No redness or edema to any joint L hip  Pain to I/E rotation I cannot palpate posterior mass behind either knee Psychiatric: She has a normal mood and affect. Her behavior is normal.  Assessment & Plan:  Arthralgias  :   Exam no consistant with Baker's cyst in either knee.  will check arthritis panel today and get plain films  .  Relafen 500 mg bid for two weeks with food.    Venous varicosites  Extensive will give number to [facility] pt advised to call for appt  See me in 3 weeks or sooner prn

## 2013-05-02 LAB — URIC ACID: Uric Acid, Serum: 5.8 mg/dL (ref 2.4–7.0)

## 2013-05-02 LAB — RHEUMATOID FACTOR: Rhuematoid fact SerPl-aCnc: <10 IU/mL (ref <=14)

## 2013-05-06 ENCOUNTER — Telehealth: Payer: Self-pay | Admitting: *Deleted

## 2013-05-06 ENCOUNTER — Encounter: Payer: Self-pay | Admitting: *Deleted

## 2013-05-06 ENCOUNTER — Other Ambulatory Visit: Payer: Self-pay | Admitting: *Deleted

## 2013-05-06 NOTE — Telephone Encounter (Signed)
Refill request

## 2013-05-06 NOTE — Telephone Encounter (Signed)
Stephanie Turner called over the weekend and left a message requesting someone call her with her lab results.

## 2013-05-06 NOTE — Telephone Encounter (Signed)
Pt returned call regarding her recent lab results

## 2013-05-06 NOTE — Telephone Encounter (Signed)
Notified pt of normal lab results, copy mailed to pt home address

## 2013-05-07 MED ORDER — ATENOLOL 50 MG PO TABS: 50.0000 mg | ORAL_TABLET | Freq: Every day | ORAL | Status: DC

## 2013-05-08 ENCOUNTER — Ambulatory Visit (HOSPITAL_BASED_OUTPATIENT_CLINIC_OR_DEPARTMENT_OTHER)
Admission: RE | Admit: 2013-05-08 | Discharge: 2013-05-08 | Disposition: A | Discharge status: Home / Self Care | Payer: Federal, State, Local not specified - PPO | Source: Ambulatory Visit | Attending: Internal Medicine | Admitting: Internal Medicine

## 2013-05-08 DIAGNOSIS — Z1231 Encounter for screening mammogram for malignant neoplasm of breast: Secondary | ICD-10-CM | POA: Insufficient documentation

## 2013-05-20 ENCOUNTER — Ambulatory Visit: Payer: Federal, State, Local not specified - PPO | Admitting: Internal Medicine

## 2013-06-20 ENCOUNTER — Other Ambulatory Visit: Payer: Self-pay | Admitting: *Deleted

## 2013-06-20 ENCOUNTER — Telehealth: Payer: Self-pay | Admitting: *Deleted

## 2013-06-20 DIAGNOSIS — M25559 Pain in unspecified hip: Secondary | ICD-10-CM

## 2013-06-20 DIAGNOSIS — M25569 Pain in unspecified knee: Secondary | ICD-10-CM

## 2013-06-20 MED ORDER — NABUMETONE 500 MG PO TABS: ORAL_TABLET | ORAL | Status: DC

## 2013-06-20 NOTE — Telephone Encounter (Signed)
Stephanie Turner  [facility]  Tell pt I want her to see an orthopedic MD that does not do surgery  (Dr. Lunette Stands)  Set up referral to her

## 2013-06-20 NOTE — Telephone Encounter (Signed)
See Heather's note I think pt is referring to Relafen.

## 2013-06-20 NOTE — Telephone Encounter (Signed)
She also said that she took the medication (antibiotic) for her bursitis , but it is still bothering her.  She doesn't think she took it long enough and would like to know if she can have more medication to help her discomfort.  Call her at [phone].  She also called to update Dr Constance Goltz that the [facility] no longer taking her insurance; so she is now seeing [facility].  She is seeing Dr Karren Cobble. Their phone # is [phone].  She said she sees them again next week for an ultrasound and will have them send office notes to [place].

## 2013-06-20 NOTE — Telephone Encounter (Signed)
Refilled medication for 2 weeks worth vs 1 week with one refill. Also notified her that we will set her up with her appt with Dr Charlett Blake. Pt wants to wait until after Holidays before we make an appt

## 2013-07-08 ENCOUNTER — Other Ambulatory Visit (INDEPENDENT_AMBULATORY_CARE_PROVIDER_SITE_OTHER): Payer: Federal, State, Local not specified - PPO

## 2013-07-08 DIAGNOSIS — Z780 Asymptomatic menopausal state: Secondary | ICD-10-CM

## 2013-07-08 DIAGNOSIS — E2839 Other primary ovarian failure: Secondary | ICD-10-CM

## 2013-07-08 DIAGNOSIS — Z0000 Encounter for general adult medical examination without abnormal findings: Secondary | ICD-10-CM

## 2013-07-08 DIAGNOSIS — I10 Essential (primary) hypertension: Secondary | ICD-10-CM

## 2013-07-08 LAB — CBC WITH DIFFERENTIAL/PLATELET
Basophils Absolute: 0 10*3/uL (ref 0.0–0.1)
Basophils Relative: 0 % (ref 0–1)
HCT: 42.5 % (ref 36.0–46.0)
MCHC: 33.4 g/dL (ref 30.0–36.0)
Neutro Abs: 2.8 10*3/uL (ref 1.7–7.7)
Neutrophils Relative %: 46 % (ref 43–77)
Platelets: 239 10*3/uL (ref 150–400)
RBC: 4.74 MIL/uL (ref 3.87–5.11)
RDW: 14.6 % (ref 11.5–15.5)
WBC: 6.2 10*3/uL (ref 4.0–10.5)

## 2013-07-08 LAB — COMPREHENSIVE METABOLIC PANEL
ALT: 15 U/L (ref 0–35)
AST: 20 U/L (ref 0–37)
Albumin: 4.2 g/dL (ref 3.5–5.2)
BUN: 15 mg/dL (ref 6–23)
CO2: 27 mEq/L (ref 19–32)
Calcium: 9 mg/dL (ref 8.4–10.5)
Chloride: 104 mEq/L (ref 96–112)
Creat: 0.75 mg/dL (ref 0.50–1.10)
Glucose, Bld: 95 mg/dL (ref 70–99)
Potassium: 4.1 mEq/L (ref 3.5–5.3)

## 2013-07-08 LAB — LIPID PANEL
Cholesterol: 192 mg/dL (ref 0–200)
Total CHOL/HDL Ratio: 3 Ratio
Triglycerides: 93 mg/dL (ref <150)

## 2013-07-10 ENCOUNTER — Encounter: Payer: Self-pay | Admitting: *Deleted

## 2013-07-15 ENCOUNTER — Ambulatory Visit (INDEPENDENT_AMBULATORY_CARE_PROVIDER_SITE_OTHER): Payer: Federal, State, Local not specified - PPO | Admitting: Internal Medicine

## 2013-07-15 DIAGNOSIS — Z0000 Encounter for general adult medical examination without abnormal findings: Secondary | ICD-10-CM

## 2013-07-15 DIAGNOSIS — D351 Benign neoplasm of parathyroid gland: Secondary | ICD-10-CM

## 2013-07-15 DIAGNOSIS — E2839 Other primary ovarian failure: Secondary | ICD-10-CM

## 2013-07-15 DIAGNOSIS — I8390 Asymptomatic varicose veins of unspecified lower extremity: Secondary | ICD-10-CM

## 2013-07-15 DIAGNOSIS — M810 Age-related osteoporosis without current pathological fracture: Secondary | ICD-10-CM

## 2013-07-15 DIAGNOSIS — E213 Hyperparathyroidism, unspecified: Secondary | ICD-10-CM

## 2013-07-15 DIAGNOSIS — E785 Hyperlipidemia, unspecified: Secondary | ICD-10-CM

## 2013-07-15 LAB — HEMOCCULT GUIAC POC 1CARD (OFFICE): Fecal Occult Blood, POC: NEGATIVE

## 2013-07-15 NOTE — Patient Instructions (Signed)
Set up Dexa scan at [facility]  See me as needed

## 2013-07-15 NOTE — Progress Notes (Signed)
Subjective:    Patient ID: Stephanie Turner, female    DOB: 1945-08-16, 68 y.o.   MRN: [id number]  HPI  Stephanie Turner is here for CPE  She did see vascular surgeon at [facility].  See scanned note.   NO intervention at this time  She reports occasional achiness in Left hip.  She does not wish any further evaluation for this now  Regarding abnormal pap  She has cryosurgery over 15 years ago.  Paps have all been normal per her report since cryo done.  Normal pap done  She continues to repeatedly decline colonoscopy despite my recommendations of necessity.   Seh is a former smoker,  Less that 1/2 pack for 22 years    No Known Allergies Past Medical History  Diagnosis Date  . Palpitations   . Abnormal cervical Papanicolaou smear   . Hyperparathyroidism   . Cataract   . Osteoporosis    Past Surgical History  Procedure Laterality Date  . Hernia repair  1997   History   Social History  . Marital Status: Married    Spouse Name: N/A    Number of Children: N/A  . Years of Education: N/A   Occupational History  . Not on file.   Social History Main Topics  . Smoking status: Former Smoker    Quit date: 07/11/1984  . Smokeless tobacco: Never Used  . Alcohol Use: No  . Drug Use: No  . Sexual Activity: Yes    Birth Control/ Protection: Post-menopausal   Other Topics Concern  . Not on file   Social History Narrative  . No narrative on file   Family History  Problem Relation Age of Onset  . Cancer Mother     leukemia   Patient Active Problem List   Diagnosis Date Noted  . Hyperlipidemia 07/15/2013  . Varicose veins 05/01/2013  . 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 Hypercalcemia 07/20/2011  . Abnormal cervical Papanicolaou smear   . Palpitations   . Hyperparathyroidism   . Cataract   . Osteoporosis    Current Outpatient Prescriptions on File Prior to Visit  Medication Sig Dispense Refill  . aspirin 81 MG tablet Take 81 mg by mouth daily.      Marland Kitchen atenolol (TENORMIN) 50  MG tablet Take 1 tablet (50 mg total) by mouth daily.  90 tablet  3  . fish oil-omega-3 fatty acids 1000 MG capsule Take 1 capsule by mouth daily.      Marland Kitchen GARLIC PO Take 1 tablet by mouth daily.      . Multiple Vitamin (MULTIVITAMIN) tablet Take 1 tablet by mouth daily.      . nabumetone (RELAFEN) 500 MG tablet Take one bid with food for 2 weeks  28 tablet  0  . ROYAL JELLY PO Take 1 tablet by mouth daily.       No current facility-administered medications on file prior to visit.      Review of Systems     Objective:   Physical Exam  Physical Exam  Nursing note and vitals reviewed.  Constitutional: She is oriented to person, place, and time. She appears well-developed and well-nourished.  HENT:  Head: Normocephalic and atraumatic.  Right Ear: Tympanic membrane and ear canal normal. No drainage. Tympanic membrane is not injected and not erythematous.  Left Ear: Tympanic membrane and ear canal normal. No drainage. Tympanic membrane is not injected and not erythematous.  Nose: Nose normal. Right sinus exhibits no maxillary sinus tenderness and no frontal sinus tenderness.  Left sinus exhibits no maxillary sinus tenderness and no frontal sinus tenderness.  Mouth/Throat: Oropharynx is clear and moist. No oral lesions. No oropharyngeal exudate.  Eyes: Conjunctivae and EOM are normal. Pupils are equal, round, and reactive to light.  Neck: Normal range of motion. Neck supple. No JVD present. Carotid bruit is not present. No mass and no thyromegaly present.  Cardiovascular: Normal rate, regular rhythm, S1 normal, S2 normal and intact distal pulses. Exam reveals no gallop and no friction rub.  No murmur heard.  Pulses:  Carotid pulses are 2+ on the right side, and 2+ on the left side.  Dorsalis pedis pulses are 2+ on the right side, and 2+ on the left side.  No carotid bruit. No LE edema  Pulmonary/Chest: Breath sounds normal. She has no wheezes. She has no rales. She exhibits no tenderness.    Abdominal: Soft. Bowel sounds are normal. She exhibits no distension and no mass. There is no hepatosplenomegaly. There is no tenderness. There is no CVA tenderness.  Musculoskeletal: Normal range of motion.  No active synovitis to joints.  Lymphadenopathy:  She has no cervical adenopathy.  She has no axillary adenopathy.  Right: No inguinal and no supraclavicular adenopathy present.  Left: No inguinal and no supraclavicular adenopathy present.  Neurological: She is alert and oriented to person, place, and time. She has normal strength and normal reflexes. She displays no tremor. No cranial nerve deficit or sensory deficit. Coordination and gait normal.  Skin: Skin is warm and dry. No rash noted. No cyanosis. Nails show no clubbing.  Psychiatric: She has a normal mood and affect. Her speech is normal and behavior is normal. Cognition and memory are normal.          Assessment & Plan:  Health Maintenance:  Repeatedly declines colonoscopy despite my counsel that she needs it.  Pap next year.  Will schedule DEXA.  Does not meet criteria for screening CT.  UTD with vaccines  DJD  Nsaid prn  Minimal hyperlipidemia    Hypercalcemia S/P  Parathyroidectomy  Recent calcium normal    Venous varicosities.

## 2013-07-16 ENCOUNTER — Ambulatory Visit (HOSPITAL_COMMUNITY): Payer: Federal, State, Local not specified - PPO

## 2013-07-22 ENCOUNTER — Ambulatory Visit (HOSPITAL_COMMUNITY): Payer: Federal, State, Local not specified - PPO

## 2013-07-22 ENCOUNTER — Ambulatory Visit (HOSPITAL_COMMUNITY)
Admission: RE | Admit: 2013-07-22 | Discharge: 2013-07-22 | Disposition: A | Discharge status: Home / Self Care | Payer: Federal, State, Local not specified - PPO | Source: Ambulatory Visit | Attending: Internal Medicine | Admitting: Internal Medicine

## 2013-07-22 DIAGNOSIS — Z780 Asymptomatic menopausal state: Secondary | ICD-10-CM | POA: Insufficient documentation

## 2013-07-22 DIAGNOSIS — Z13820 Encounter for screening for osteoporosis: Secondary | ICD-10-CM | POA: Insufficient documentation

## 2013-07-22 DIAGNOSIS — E2839 Other primary ovarian failure: Secondary | ICD-10-CM

## 2013-07-24 ENCOUNTER — Telehealth: Payer: Self-pay | Admitting: Internal Medicine

## 2013-07-24 NOTE — Telephone Encounter (Signed)
Stephanie Turner    Call pt and let her know that her bone density is normal

## 2013-07-24 NOTE — Telephone Encounter (Signed)
Called and spoke with Stephanie Turner and gave her DEXA results.

## 2013-08-11 ENCOUNTER — Encounter: Payer: Self-pay | Admitting: Internal Medicine

## 2013-08-12 ENCOUNTER — Encounter: Payer: Self-pay | Admitting: *Deleted

## 2013-08-12 ENCOUNTER — Telehealth: Payer: Self-pay | Admitting: *Deleted

## 2013-08-12 NOTE — Telephone Encounter (Signed)
Returned pt call  

## 2014-02-04 ENCOUNTER — Other Ambulatory Visit: Payer: Self-pay | Admitting: Internal Medicine

## 2014-02-04 DIAGNOSIS — Z1231 Encounter for screening mammogram for malignant neoplasm of breast: Secondary | ICD-10-CM

## 2014-04-24 ENCOUNTER — Other Ambulatory Visit: Payer: Self-pay | Admitting: Internal Medicine

## 2014-04-24 ENCOUNTER — Ambulatory Visit (INDEPENDENT_AMBULATORY_CARE_PROVIDER_SITE_OTHER): Payer: Federal, State, Local not specified - PPO

## 2014-04-24 DIAGNOSIS — Z1231 Encounter for screening mammogram for malignant neoplasm of breast: Secondary | ICD-10-CM

## 2014-04-25 ENCOUNTER — Ambulatory Visit (HOSPITAL_BASED_OUTPATIENT_CLINIC_OR_DEPARTMENT_OTHER): Payer: Federal, State, Local not specified - PPO

## 2014-04-28 ENCOUNTER — Ambulatory Visit (HOSPITAL_BASED_OUTPATIENT_CLINIC_OR_DEPARTMENT_OTHER): Payer: Federal, State, Local not specified - PPO

## 2014-05-12 ENCOUNTER — Encounter: Payer: Self-pay | Admitting: Internal Medicine

## 2014-06-17 ENCOUNTER — Other Ambulatory Visit: Payer: Self-pay | Admitting: *Deleted

## 2014-06-17 NOTE — Telephone Encounter (Signed)
Refill request

## 2014-06-18 MED ORDER — ATENOLOL 50 MG PO TABS: 50.0000 mg | ORAL_TABLET | Freq: Every day | ORAL | Status: DC

## 2014-06-24 ENCOUNTER — Telehealth: Payer: Self-pay

## 2014-06-24 NOTE — Telephone Encounter (Signed)
Dr. Coralyn Mark  Can you please see if it's ok for pt to rcv Prevnar 13? Do we call this in to the pharmacy or is it stocked in house?  Thanks MH

## 2014-06-24 NOTE — Telephone Encounter (Signed)
Prevnar 13 ordered and Mariann Laster will call to schedule pt for nurse visit

## 2014-06-24 NOTE — Telephone Encounter (Signed)
Daria Mcmeekin [phone]  Margaretha Sheffield called and was wanting to know if she could get the Prevnar 13 shot for pneumonia. Please call and let her know.

## 2014-07-01 ENCOUNTER — Telehealth: Payer: Self-pay | Admitting: *Deleted

## 2014-07-01 NOTE — Telephone Encounter (Signed)
-----   Message from Tarry Kos sent at 07/01/2014 10:43 AM EST ----- Contact: [phone] She is scheduled for CPE 1/13.  She says in the past Jolayne Haines would draw her labs a week prior, because she is a hard stick and the [employer] lab personnel were not very successful.  She wants to know if you also want to try drawing her this year or do you want her to go upstairs?

## 2014-07-01 NOTE — Telephone Encounter (Signed)
I Left Stephanie Turner a message letting her know that I would be happy to draw her labs for her -eh

## 2014-07-07 ENCOUNTER — Other Ambulatory Visit: Payer: Self-pay | Admitting: *Deleted

## 2014-07-07 DIAGNOSIS — Z0000 Encounter for general adult medical examination without abnormal findings: Secondary | ICD-10-CM

## 2014-07-16 ENCOUNTER — Other Ambulatory Visit (INDEPENDENT_AMBULATORY_CARE_PROVIDER_SITE_OTHER): Payer: Federal, State, Local not specified - PPO

## 2014-07-16 DIAGNOSIS — E785 Hyperlipidemia, unspecified: Secondary | ICD-10-CM

## 2014-07-16 DIAGNOSIS — E213 Hyperparathyroidism, unspecified: Secondary | ICD-10-CM

## 2014-07-17 LAB — CBC WITH DIFFERENTIAL/PLATELET
Basophils Absolute: 0 10*3/uL (ref 0.0–0.1)
Basophils Relative: 0 % (ref 0–1)
Eosinophils Absolute: 0.2 10*3/uL (ref 0.0–0.7)
Eosinophils Relative: 2 % (ref 0–5)
HEMATOCRIT: 43 % (ref 36.0–46.0)
Hemoglobin: 14.5 g/dL (ref 12.0–15.0)
LYMPHS ABS: 2.6 10*3/uL (ref 0.7–4.0)
Lymphocytes Relative: 33 % (ref 12–46)
MCH: 30.3 pg (ref 26.0–34.0)
MCHC: 33.7 g/dL (ref 30.0–36.0)
MCV: 90 fL (ref 78.0–100.0)
MONOS PCT: 9 % (ref 3–12)
MPV: 10 fL (ref 9.4–12.4)
Monocytes Absolute: 0.7 10*3/uL (ref 0.1–1.0)
NEUTROS ABS: 4.5 10*3/uL (ref 1.7–7.7)
Neutrophils Relative %: 56 % (ref 43–77)
Platelets: 242 10*3/uL (ref 150–400)
RBC: 4.78 MIL/uL (ref 3.87–5.11)
RDW: 14 % (ref 11.5–15.5)
WBC: 8 10*3/uL (ref 4.0–10.5)

## 2014-07-17 LAB — COMPLETE METABOLIC PANEL WITH GFR
ALT: 16 U/L (ref 0–35)
AST: 21 U/L (ref 0–37)
Albumin: 4.3 g/dL (ref 3.5–5.2)
Alkaline Phosphatase: 75 U/L (ref 39–117)
BUN: 18 mg/dL (ref 6–23)
CALCIUM: 9.5 mg/dL (ref 8.4–10.5)
CO2: 25 meq/L (ref 19–32)
CREATININE: 0.88 mg/dL (ref 0.50–1.10)
Chloride: 103 mEq/L (ref 96–112)
GFR, Est African American: 78 mL/min
GFR, Est Non African American: 68 mL/min
Glucose, Bld: 96 mg/dL (ref 70–99)
Potassium: 4.2 mEq/L (ref 3.5–5.3)
Sodium: 142 mEq/L (ref 135–145)
Total Bilirubin: 0.6 mg/dL (ref 0.2–1.2)
Total Protein: 6.9 g/dL (ref 6.0–8.3)

## 2014-07-17 LAB — LIPID PANEL
Cholesterol: 204 mg/dL — ABNORMAL HIGH (ref 0–200)
HDL: 73 mg/dL (ref >39)
LDL CALC: 113 mg/dL — AB (ref 0–99)
Total CHOL/HDL Ratio: 2.8 Ratio
Triglycerides: 90 mg/dL (ref <150)
VLDL: 18 mg/dL (ref 0–40)

## 2014-07-17 LAB — VITAMIN D 25 HYDROXY (VIT D DEFICIENCY, FRACTURES): Vit D, 25-Hydroxy: 30 ng/mL (ref 30–100)

## 2014-07-17 LAB — TSH: TSH: 1.813 u[IU]/mL (ref 0.350–4.500)

## 2014-07-17 NOTE — Progress Notes (Signed)
Mailed patient a copy of labs -eh

## 2014-07-23 ENCOUNTER — Encounter: Payer: Self-pay | Admitting: Internal Medicine

## 2014-07-23 ENCOUNTER — Ambulatory Visit (INDEPENDENT_AMBULATORY_CARE_PROVIDER_SITE_OTHER): Payer: Federal, State, Local not specified - PPO | Admitting: Internal Medicine

## 2014-07-23 VITALS — BP 114/75 | HR 80 | Resp 16 | Ht 59.75 in | Wt 194.0 lb

## 2014-07-23 DIAGNOSIS — E785 Hyperlipidemia, unspecified: Secondary | ICD-10-CM

## 2014-07-23 DIAGNOSIS — Z1151 Encounter for screening for human papillomavirus (HPV): Secondary | ICD-10-CM

## 2014-07-23 DIAGNOSIS — Z124 Encounter for screening for malignant neoplasm of cervix: Secondary | ICD-10-CM

## 2014-07-23 DIAGNOSIS — E2839 Other primary ovarian failure: Secondary | ICD-10-CM

## 2014-07-23 DIAGNOSIS — D351 Benign neoplasm of parathyroid gland: Secondary | ICD-10-CM

## 2014-07-23 DIAGNOSIS — Z1211 Encounter for screening for malignant neoplasm of colon: Secondary | ICD-10-CM

## 2014-07-23 DIAGNOSIS — Z0000 Encounter for general adult medical examination without abnormal findings: Secondary | ICD-10-CM | POA: Diagnosis not present

## 2014-07-23 LAB — POCT URINALYSIS DIPSTICK
Bilirubin, UA: NEGATIVE
Blood, UA: NEGATIVE
GLUCOSE UA: NEGATIVE
KETONES UA: NEGATIVE
Leukocytes, UA: NEGATIVE
Nitrite, UA: NEGATIVE
SPEC GRAV UA: 1.02
Urobilinogen, UA: NEGATIVE
pH, UA: 6.5

## 2014-07-23 LAB — HEMOCCULT GUIAC POC 1CARD (OFFICE)
Card #1 Date: 1132016
Fecal Occult Blood, POC: NEGATIVE

## 2014-07-23 NOTE — Progress Notes (Signed)
Subjective:    Patient ID: Stephanie Turner, female    DOB: 07/19/1945, 69 y.o.   MRN: [id number]  HPI 07/2013 note Health Maintenance: Repeatedly declines colonoscopy despite my counsel that she needs it. Pap next year. Will schedule DEXA. Does not meet criteria for screening CT. UTD with vaccines  DJD Nsaid prn  Minimal hyperlipidemia   Hypercalcemia S/P Parathyroidectomy Recent calcium normal   Venous varicosities.   TODAY   [name] is here for CPE  HM:  UTD with vaccines.  ,  Pap 2014,  Repeatedly declines colonoscopy.  She quit smoking at age 69.  41 year smoker  Less than 1/2 ppd  Sister recently diagnosed with abnormal pap and pt is worried  No vaginal bleeding.   No Known Allergies Past Medical History  Diagnosis Date  . Palpitations   . Abnormal cervical Papanicolaou smear   . Hyperparathyroidism   . Cataract   . Osteoporosis    Past Surgical History  Procedure Laterality Date  . Hernia repair  1997   History   Social History  . Marital Status: Married    Spouse Name: N/A    Number of Children: N/A  . Years of Education: N/A   Occupational History  . Not on file.   Social History Main Topics  . Smoking status: Former Smoker    Quit date: 07/11/1984  . Smokeless tobacco: Never Used  . Alcohol Use: No  . Drug Use: No  . Sexual Activity: Yes    Birth Control/ Protection: Post-menopausal   Other Topics Concern  . Not on file   Social History Narrative   Family History  Problem Relation Age of Onset  . Cancer Mother     leukemia   Patient Active Problem List   Diagnosis Date Noted  . Hyperlipidemia 07/15/2013  . Varicose veins 05/01/2013  . 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 Hypercalcemia 07/20/2011  . Abnormal cervical Papanicolaou smear   . Palpitations   . Hyperparathyroidism   . Cataract   . Osteoporosis    Current Outpatient Prescriptions on File Prior to Visit  Medication Sig Dispense Refill  . aspirin 81 MG tablet  Take 81 mg by mouth daily.    Marland Kitchen atenolol (TENORMIN) 50 MG tablet Take 1 tablet (50 mg total) by mouth daily. 90 tablet 3  . fish oil-omega-3 fatty acids 1000 MG capsule Take 1 capsule by mouth daily.    Marland Kitchen GARLIC PO Take 1 tablet by mouth daily.    . Multiple Vitamin (MULTIVITAMIN) tablet Take 1 tablet by mouth daily.    . nabumetone (RELAFEN) 500 MG tablet Take one bid with food for 2 weeks 28 tablet 0  . ROYAL JELLY PO Take 1 tablet by mouth daily.     No current facility-administered medications on file prior to visit.       Review of Systems  Respiratory: Negative for cough, chest tightness, shortness of breath and wheezing.   Cardiovascular: Negative for chest pain, palpitations and leg swelling.  All other systems reviewed and are negative.      Objective:   Physical Exam  Physical Exam  Vital signs and nursing note reviewed  Constitutional: She is oriented to person, place, and time. She appears well-developed and well-nourished. She is cooperative.  HENT:  Head: Normocephalic and atraumatic.  Right Ear: Tympanic membrane normal.  Left Ear: Tympanic membrane normal.  Nose: Nose normal.  Mouth/Throat: Oropharynx is clear and moist and mucous membranes are normal. No oropharyngeal exudate or  posterior oropharyngeal erythema.  Eyes: Conjunctivae and EOM are normal. Pupils are equal, round, and reactive to light.  Neck: Neck supple. No JVD present. Carotid bruit is not present. No mass and no thyromegaly present.  Cardiovascular: Regular rhythm, normal heart sounds, intact distal pulses and normal pulses.  Exam reveals no gallop and no friction rub.   No murmur heard. Pulses:      Dorsalis pedis pulses are 2+ on the right side, and 2+ on the left side.  Pulmonary/Chest: Breath sounds normal. She has no wheezes. She has no rhonchi. She has no rales. Right breast exhibits no mass, no nipple discharge and no skin change. Left breast exhibits no mass, no nipple discharge and no  skin change.  Abdominal: Soft. Bowel sounds are normal. She exhibits no distension and no mass. There is no hepatosplenomegaly. There is no tenderness. There is no CVA tenderness.  Genitourinary: Rectum normal, vagina normal and uterus normal. Rectal exam shows no mass. Guaiac negative stool. No labial fusion. There is no lesion on the right labia. There is no lesion on the left labia. Cervix exhibits no motion tenderness. Right adnexum displays no mass, no tenderness and no fullness. Left adnexum displays no mass, no tenderness and no fullness. No erythema around the vagina.  Musculoskeletal:       No active synovitis to any joint.    Lymphadenopathy:       Right cervical: No superficial cervical adenopathy present.      Left cervical: No superficial cervical adenopathy present.       Right axillary: No pectoral and no lateral adenopathy present.       Left axillary: No pectoral and no lateral adenopathy present.      Right: No inguinal adenopathy present.       Left: No inguinal adenopathy present.  Neurological: She is alert and oriented to person, place, and time. She has normal strength and normal reflexes. No cranial nerve deficit or sensory deficit. She displays a negative Romberg sign. Coordination and gait normal.  Skin: Skin is warm and dry. No abrasion, no bruising, no ecchymosis and no rash noted. No cyanosis. Nails show no clubbing.  Psychiatric: She has a normal mood and affect. Her speech is normal and behavior is normal.          Assessment & Plan:   HM pap today .    Advised colonoscopy pt declines  Hyperlipidemia  minimal DASH diet  Hyperparathyroidsism  S/P parathyrodectomy      Assessment & Plan:

## 2014-07-23 NOTE — Patient Instructions (Signed)
See me as needed 

## 2014-07-24 ENCOUNTER — Encounter: Payer: Self-pay | Admitting: *Deleted

## 2014-07-25 LAB — CYTOLOGY - PAP

## 2014-08-04 ENCOUNTER — Telehealth: Payer: Self-pay | Admitting: *Deleted

## 2014-08-04 NOTE — Telephone Encounter (Signed)
I spoke with Stephanie Turner in regards to her labs- eh

## 2014-08-04 NOTE — Telephone Encounter (Signed)
-----   Message from Shawna Clamp sent at 08/04/2014 12:54 PM EST ----- Regarding: Speak to you about results Contact: [phone] Patient called would like to speak to you or Dr.Schonehoff regarding a letter she got about her pap smear results. I told her you would call her after patients today.  Thanks :)

## 2015-01-05 ENCOUNTER — Other Ambulatory Visit (HOSPITAL_BASED_OUTPATIENT_CLINIC_OR_DEPARTMENT_OTHER): Payer: Self-pay | Admitting: Internal Medicine

## 2015-01-05 ENCOUNTER — Other Ambulatory Visit: Payer: Self-pay | Admitting: Family Medicine

## 2015-01-05 ENCOUNTER — Other Ambulatory Visit (HOSPITAL_BASED_OUTPATIENT_CLINIC_OR_DEPARTMENT_OTHER): Payer: Self-pay | Admitting: Obstetrics and Gynecology

## 2015-01-05 DIAGNOSIS — Z1231 Encounter for screening mammogram for malignant neoplasm of breast: Secondary | ICD-10-CM

## 2015-04-24 ENCOUNTER — Ambulatory Visit (HOSPITAL_BASED_OUTPATIENT_CLINIC_OR_DEPARTMENT_OTHER)
Admission: RE | Admit: 2015-04-24 | Discharge: 2015-04-24 | Disposition: A | Discharge status: Home / Self Care | Payer: Federal, State, Local not specified - PPO | Source: Ambulatory Visit | Attending: Obstetrics and Gynecology | Admitting: Obstetrics and Gynecology

## 2015-04-24 DIAGNOSIS — Z1231 Encounter for screening mammogram for malignant neoplasm of breast: Secondary | ICD-10-CM | POA: Diagnosis not present

## 2015-04-30 ENCOUNTER — Telehealth: Payer: Self-pay | Admitting: Family Medicine

## 2015-04-30 NOTE — Telephone Encounter (Signed)
Caller name:Lamisha Martell Relationship to patient: Can be [phone] or cell [phone]( you may leave) Pharmacy:  Reason for call:Requesting mammogram results

## 2015-04-30 NOTE — Telephone Encounter (Signed)
MGM was normal she should get notice from radiology

## 2015-05-01 NOTE — Telephone Encounter (Signed)
Patient informed of results.  

## 2015-05-11 ENCOUNTER — Telehealth: Payer: Self-pay | Admitting: Internal Medicine

## 2015-05-11 MED ORDER — ATENOLOL 50 MG PO TABS: 50.0000 mg | ORAL_TABLET | Freq: Every day | ORAL | Status: DC

## 2015-05-11 NOTE — Telephone Encounter (Signed)
Refill done and patient informed 

## 2015-05-11 NOTE — Telephone Encounter (Signed)
Caller name: Arthi   Relationship to patient: Self  Can be reached: [phone]  Pharmacy: [id number] - [place], [place]  Reason for call: Pt is requesting a refill on her   Atenolol Rx.

## 2015-06-15 ENCOUNTER — Encounter: Payer: Self-pay | Admitting: Family Medicine

## 2015-06-15 ENCOUNTER — Ambulatory Visit (INDEPENDENT_AMBULATORY_CARE_PROVIDER_SITE_OTHER): Payer: Federal, State, Local not specified - PPO | Admitting: Family Medicine

## 2015-06-15 VITALS — BP 130/72 | HR 70 | Temp 97.6°F | Ht 61.0 in | Wt 187.2 lb

## 2015-06-15 DIAGNOSIS — T7840XA Allergy, unspecified, initial encounter: Secondary | ICD-10-CM | POA: Diagnosis not present

## 2015-06-15 DIAGNOSIS — E785 Hyperlipidemia, unspecified: Secondary | ICD-10-CM | POA: Diagnosis not present

## 2015-06-15 DIAGNOSIS — R002 Palpitations: Secondary | ICD-10-CM | POA: Diagnosis not present

## 2015-06-15 DIAGNOSIS — Z8619 Personal history of other infectious and parasitic diseases: Secondary | ICD-10-CM | POA: Insufficient documentation

## 2015-06-15 MED ORDER — ATENOLOL 50 MG PO TABS: 50.0000 mg | ORAL_TABLET | Freq: Every day | ORAL | Status: AC

## 2015-06-15 NOTE — Progress Notes (Signed)
Pre visit review using our clinic review tool, if applicable. No additional management support is needed unless otherwise documented below in the visit note. 

## 2015-06-15 NOTE — Patient Instructions (Signed)
Try ice and then apply Salon Pas or Aspercreme to thumbs twice daily, consider thumb splints   Cholesterol Cholesterol is a white, waxy, fat-like substance needed by your body in small amounts. The liver makes all the cholesterol you need. Cholesterol is carried from the liver by the blood through the blood vessels. Deposits of cholesterol (plaque) may build up on blood vessel walls. These make the arteries narrower and stiffer. Cholesterol plaques increase the risk for heart attack and stroke.  You cannot feel your cholesterol level even if it is very high. The only way to know it is high is with a blood test. Once you know your cholesterol levels, you should keep a record of the test results. Work with your health care provider to keep your levels in the desired range.  WHAT DO THE RESULTS MEAN?  Total cholesterol is a rough measure of all the cholesterol in your blood.   LDL is the so-called bad cholesterol. This is the type that deposits cholesterol in the walls of the arteries. You want this level to be low.   HDL is the good cholesterol because it cleans the arteries and carries the LDL away. You want this level to be high.  Triglycerides are fat that the body can either burn for energy or store. High levels are closely linked to heart disease.  WHAT ARE THE DESIRED LEVELS OF CHOLESTEROL?  Total cholesterol below 200.   LDL below 100 for people at risk, below 70 for those at very high risk.   HDL above 50 is good, above 60 is best.   Triglycerides below 150.  HOW CAN I LOWER MY CHOLESTEROL?  Diet. Follow your diet programs as directed by your health care provider.   Choose fish or white meat chicken and [place], roasted or baked. Limit fatty cuts of red meat, fried foods, and processed meats, such as sausage and lunch meats.   Eat lots of fresh fruits and vegetables.  Choose whole grains, beans, pasta, potatoes, and cereals.   Use only small amounts of olive, corn, or  canola oils.   Avoid butter, mayonnaise, shortening, or palm kernel oils.  Avoid foods with trans fats.   Drink skim or nonfat milk and eat low-fat or nonfat yogurt and cheeses. Avoid whole milk, cream, ice cream, egg yolks, and full-fat cheeses.   Healthy desserts include angel food cake, ginger snaps, animal crackers, hard candy, popsicles, and low-fat or nonfat frozen yogurt. Avoid pastries, cakes, pies, and cookies.   Exercise. Follow your exercise programs as directed by your health care provider.   A regular program helps decrease LDL and raise HDL.   A regular program helps with weight control.   Do things that increase your activity level like gardening, walking, or taking the stairs. Ask your health care provider about how you can be more active in your daily life.   Medicine. Take medicine only as directed by your health care provider.   Medicine may be prescribed by your health care provider to help lower cholesterol and decrease the risk for heart disease.   If you have several risk factors, you may need medicine even if your levels are normal.   This information is not intended to replace advice given to you by your health care provider. Make sure you discuss any questions you have with your health care provider.   Document Released: 03/22/2001 Document Revised: 07/18/2014 Document Reviewed: 04/10/2013 Elsevier Interactive Patient Education [employer].

## 2015-06-20 NOTE — Progress Notes (Signed)
Subjective:    Patient ID: Stephanie Turner, female    DOB: Dec 12, 1945, 69 y.o.   MRN: [id number]  Chief Complaint  Patient presents with  . Establish Care    HPI Patient is in today for patient in today to establish care. Her previous PMD has left practice. No recent illness. No acute concerns. Has a PMH that includes hyperparathyroidism, hypercalcemia, hyperlipidemia, palpitations and allergies. Everything is well controlled at this time. Denies CP/palp/SOB/HA/congestion/fevers/GI or GU c/o. Taking meds as prescribed  Past Medical History  Diagnosis Date  . Palpitations   . Abnormal cervical Papanicolaou smear   . Hyperparathyroidism   . Cataract   . Osteoporosis   . History of chicken pox   . H/O measles   . H/O mumps   . Hyperlipidemia     Past Surgical History  Procedure Laterality Date  . Hernia repair  1997  . Parathyroid exploration      removed an over active nodule in 1998  . Eye surgery      b/l cataracts, implants with Dr Dolores Lory  . Tonsillectomy    . Cervical biopsy  w/ loop electrode excision      at age 72 normal since    Family History  Problem Relation Age of Onset  . Cancer Mother     leukemia  . Varicose Veins Mother   . Other Father     benign brain tumor  . Endometriosis Sister   . Obesity Brother   . Ulcers Daughter   . Other Son     TBI, h/o vertigo  . Heart disease Paternal Aunt   . Cancer Paternal Grandfather     stomach  . Arthritis Sister   . Obesity Sister   . Cancer Daughter     precancerous cervical changes  . Kidney disease Daughter     surgery as a child to correct ureteral concerns    Social History   Social History  . Marital Status: Married    Spouse Name: N/A  . Number of Children: N/A  . Years of Education: N/A   Occupational History  . Not on file.   Social History Main Topics  . Smoking status: Former Smoker    Quit date: 07/11/1984  . Smokeless tobacco: Never Used  . Alcohol Use: No  . Drug Use: No  .  Sexual Activity: Yes    Birth Control/ Protection: Post-menopausal     Comment: lives with husband, retired from Scientist, research (life sciences) estate. office work, preschool.  minimal meat   Other Topics Concern  . Not on file   Social History Narrative    Outpatient Prescriptions Prior to Visit  Medication Sig Dispense Refill  . aspirin 81 MG tablet Take 81 mg by mouth daily.    . fish oil-omega-3 fatty acids 1000 MG capsule Take 1 capsule by mouth daily.    Marland Kitchen GARLIC PO Take 1 tablet by mouth daily.    Marland Kitchen atenolol (TENORMIN) 50 MG tablet Take 1 tablet (50 mg total) by mouth daily. 90 tablet 0  . Multiple Vitamin (MULTIVITAMIN) tablet Take 1 tablet by mouth daily.     No facility-administered medications prior to visit.    No Known Allergies  Review of Systems  Constitutional: Negative for fever and malaise/fatigue.  HENT: Negative for congestion.   Eyes: Negative for discharge.  Respiratory: Negative for shortness of breath.   Cardiovascular: Negative for chest pain, palpitations and leg swelling.  Gastrointestinal: Negative for nausea and abdominal pain.  Genitourinary:  Negative for dysuria.  Musculoskeletal: Negative for falls.  Skin: Negative for rash.  Neurological: Negative for loss of consciousness and headaches.  Endo/Heme/Allergies: Negative for environmental allergies.  Psychiatric/Behavioral: Negative for depression. The patient is not nervous/anxious.        Objective:    Physical Exam  Constitutional: She is oriented to person, place, and time. She appears well-developed and well-nourished. No distress.  HENT:  Head: Normocephalic and atraumatic.  Nose: Nose normal.  Eyes: Right eye exhibits no discharge. Left eye exhibits no discharge.  Neck: Normal range of motion. Neck supple.  Cardiovascular: Normal rate and regular rhythm.   No murmur heard. Pulmonary/Chest: Effort normal and breath sounds normal.  Abdominal: Soft. Bowel sounds are normal. There is no tenderness.    Musculoskeletal: She exhibits no edema.  Neurological: She is alert and oriented to person, place, and time.  Skin: Skin is warm and dry.  Psychiatric: She has a normal mood and affect.  Nursing note and vitals reviewed.   BP 130/72 mmHg  Pulse 70  Temp(Src) 97.6 F (36.4 C) (Oral)  Ht 5\' 1"  (1.549 m)  Wt 187 lb 4 oz (84.936 kg)  BMI 35.40 kg/m2  SpO2 97% Wt Readings from Last 3 Encounters:  06/15/15 187 lb 4 oz (84.936 kg)  07/23/14 194 lb (87.998 kg)  05/01/13 196 lb (88.905 kg)     Lab Results  Component Value Date   WBC 8.0 07/16/2014   HGB 14.5 07/16/2014   HCT 43.0 07/16/2014   PLT 242 07/16/2014   GLUCOSE 96 07/16/2014   CHOL 204* 07/16/2014   TRIG 90 07/16/2014   HDL 73 07/16/2014   LDLCALC 113* 07/16/2014   ALT 16 07/16/2014   AST 21 07/16/2014   NA 142 07/16/2014   K 4.2 07/16/2014   CL 103 07/16/2014   CREATININE 0.88 07/16/2014   BUN 18 07/16/2014   CO2 25 07/16/2014   TSH 1.813 07/16/2014    Lab Results  Component Value Date   TSH 1.813 07/16/2014   Lab Results  Component Value Date   WBC 8.0 07/16/2014   HGB 14.5 07/16/2014   HCT 43.0 07/16/2014   MCV 90.0 07/16/2014   PLT 242 07/16/2014   Lab Results  Component Value Date   NA 142 07/16/2014   K 4.2 07/16/2014   CO2 25 07/16/2014   GLUCOSE 96 07/16/2014   BUN 18 07/16/2014   CREATININE 0.88 07/16/2014   BILITOT 0.6 07/16/2014   ALKPHOS 75 07/16/2014   AST 21 07/16/2014   ALT 16 07/16/2014   PROT 6.9 07/16/2014   ALBUMIN 4.3 07/16/2014   CALCIUM 9.5 07/16/2014   Lab Results  Component Value Date   CHOL 204* 07/16/2014   Lab Results  Component Value Date   HDL 73 07/16/2014   Lab Results  Component Value Date   LDLCALC 113* 07/16/2014   Lab Results  Component Value Date   TRIG 90 07/16/2014   Lab Results  Component Value Date   CHOLHDL 2.8 07/16/2014   No results found for: HGBA1C     Assessment & Plan:   Problem List Items Addressed This Visit     Allergic state - Primary   Hypercalcemia    Is well controlled after surgery no concerning symptoms      Hyperlipidemia    Encouraged heart healthy diet, increase exercise, avoid trans fats, consider a krill oil cap daily      Relevant Medications   atenolol (TENORMIN) 50 MG tablet  Palpitations    Well controlled on Atenolol         I have discontinued Ms. Wingard's multivitamin. I am also having her maintain her GARLIC PO, fish oil-omega-3 fatty acids, aspirin, (Flaxseed, Linseed, (FLAX SEED OIL PO)), Multiple Vitamins-Minerals (WOMENS 50+ MULTI VITAMIN/MIN PO), and atenolol.  Meds ordered this encounter  Medications  . Flaxseed, Linseed, (FLAX SEED OIL PO)    Sig: Take by mouth daily.  . Multiple Vitamins-Minerals (WOMENS 50+ MULTI VITAMIN/MIN PO)    Sig: Take by mouth daily.  Marland Kitchen atenolol (TENORMIN) 50 MG tablet    Sig: Take 1 tablet (50 mg total) by mouth daily.    Dispense:  90 tablet    Refill:  1     Penni Homans, MD

## 2015-06-20 NOTE — Assessment & Plan Note (Signed)
Well controlled on Atenolol 

## 2015-06-20 NOTE — Assessment & Plan Note (Signed)
Encouraged heart healthy diet, increase exercise, avoid trans fats, consider a krill oil cap daily 

## 2015-06-20 NOTE — Assessment & Plan Note (Signed)
Is well controlled after surgery no concerning symptoms

## 2015-06-30 ENCOUNTER — Telehealth: Payer: Self-pay | Admitting: Family Medicine

## 2015-06-30 DIAGNOSIS — E785 Hyperlipidemia, unspecified: Secondary | ICD-10-CM

## 2015-06-30 DIAGNOSIS — E0590 Thyrotoxicosis, unspecified without thyrotoxic crisis or storm: Secondary | ICD-10-CM

## 2015-06-30 NOTE — Telephone Encounter (Signed)
Caller name: Self   Can be reached: [phone]   Reason for call: Patient has appt for CPE on 4/27 and would like to have labs done one week prior.

## 2015-06-30 NOTE — Telephone Encounter (Signed)
CMP, CBC, TSH, lipid, for hyperlipidemia, hyperparathyroidism, congestion. I believe she gets her PTH checked elsewhere but if she does not we can check that also.

## 2015-06-30 NOTE — Telephone Encounter (Signed)
Let me know what labs if ok and I can enter/schedule lab appointment.

## 2015-07-01 NOTE — Telephone Encounter (Signed)
Labs entered and lab appointment scheduled for 10/09/2015.  Patient has been notified as well.

## 2015-08-05 ENCOUNTER — Other Ambulatory Visit: Payer: Self-pay | Admitting: Family Medicine

## 2015-09-04 ENCOUNTER — Other Ambulatory Visit: Payer: Self-pay | Admitting: Family Medicine

## 2015-09-04 DIAGNOSIS — E0590 Thyrotoxicosis, unspecified without thyrotoxic crisis or storm: Secondary | ICD-10-CM

## 2015-09-04 DIAGNOSIS — E785 Hyperlipidemia, unspecified: Secondary | ICD-10-CM

## 2015-09-15 ENCOUNTER — Other Ambulatory Visit (INDEPENDENT_AMBULATORY_CARE_PROVIDER_SITE_OTHER): Payer: Federal, State, Local not specified - PPO

## 2015-09-15 DIAGNOSIS — E785 Hyperlipidemia, unspecified: Secondary | ICD-10-CM

## 2015-09-15 DIAGNOSIS — E0590 Thyrotoxicosis, unspecified without thyrotoxic crisis or storm: Secondary | ICD-10-CM

## 2015-09-15 LAB — CBC WITH DIFFERENTIAL/PLATELET
BASOS ABS: 0 10*3/uL (ref 0.0–0.1)
Basophils Relative: 0.6 % (ref 0.0–3.0)
EOS ABS: 0.2 10*3/uL (ref 0.0–0.7)
Eosinophils Relative: 2.7 % (ref 0.0–5.0)
HCT: 40.8 % (ref 36.0–46.0)
Hemoglobin: 13.6 g/dL (ref 12.0–15.0)
LYMPHS ABS: 2.7 10*3/uL (ref 0.7–4.0)
LYMPHS PCT: 38.7 % (ref 12.0–46.0)
MCHC: 33.5 g/dL (ref 30.0–36.0)
MCV: 89.9 fl (ref 78.0–100.0)
MONO ABS: 0.5 10*3/uL (ref 0.1–1.0)
Monocytes Relative: 6.8 % (ref 3.0–12.0)
NEUTROS ABS: 3.6 10*3/uL (ref 1.4–7.7)
NEUTROS PCT: 51.2 % (ref 43.0–77.0)
PLATELETS: 232 10*3/uL (ref 150.0–400.0)
RBC: 4.53 Mil/uL (ref 3.87–5.11)
RDW: 14.8 % (ref 11.5–15.5)
WBC: 6.9 10*3/uL (ref 4.0–10.5)

## 2015-09-15 LAB — COMPREHENSIVE METABOLIC PANEL
ALT: 16 U/L (ref 0–35)
AST: 22 U/L (ref 0–37)
Albumin: 4.3 g/dL (ref 3.5–5.2)
Alkaline Phosphatase: 75 U/L (ref 39–117)
BILIRUBIN TOTAL: 0.7 mg/dL (ref 0.2–1.2)
BUN: 15 mg/dL (ref 6–23)
CO2: 28 meq/L (ref 19–32)
Calcium: 9.9 mg/dL (ref 8.4–10.5)
Chloride: 104 mEq/L (ref 96–112)
Creatinine, Ser: 0.79 mg/dL (ref 0.40–1.20)
GFR: 76.62 mL/min (ref >60.00)
GLUCOSE: 100 mg/dL — AB (ref 70–99)
Potassium: 4.4 mEq/L (ref 3.5–5.1)
SODIUM: 141 meq/L (ref 135–145)
Total Protein: 6.8 g/dL (ref 6.0–8.3)

## 2015-09-15 LAB — LIPID PANEL
CHOL/HDL RATIO: 3
Cholesterol: 187 mg/dL (ref 0–200)
HDL: 70.7 mg/dL (ref >39.00)
LDL Cholesterol: 96 mg/dL (ref 0–99)
NONHDL: 115.85
Triglycerides: 99 mg/dL (ref 0.0–149.0)
VLDL: 19.8 mg/dL (ref 0.0–40.0)

## 2015-09-15 LAB — TSH: TSH: 0.52 u[IU]/mL (ref 0.35–4.50)

## 2015-09-16 ENCOUNTER — Encounter: Payer: Self-pay | Admitting: Family Medicine

## 2015-09-16 NOTE — Telephone Encounter (Signed)
error:[id number] ° °

## 2015-09-17 ENCOUNTER — Telehealth: Payer: Self-pay | Admitting: Emergency Medicine

## 2015-09-17 ENCOUNTER — Other Ambulatory Visit: Payer: Self-pay | Admitting: Emergency Medicine

## 2015-09-17 DIAGNOSIS — Z0000 Encounter for general adult medical examination without abnormal findings: Secondary | ICD-10-CM

## 2015-09-17 NOTE — Telephone Encounter (Signed)
Please start letter to dismiss patient from practice for loss of therapeutic relationship and I will sign

## 2015-09-17 NOTE — Telephone Encounter (Signed)
I talked to pt appt 10:50am this morning. Before stating her name pt was upset about a urine sample that she stated she had given. I pulled up her acct and informed her a urinalysis had not been ordered. She proceeded to tell me that she had given the urine but it wasn't ordered and it was supposed to be. Pt was rude throughout the conversation. I spoke to [place] and she stated that the test wasn't ordered bc we were not sure if it would be covered. Pt stated she called her insurance and confirmed they would pay for it. Sent staff msg to [place] in lab to f/u with pt as she stated she left a sample.

## 2015-09-17 NOTE — Telephone Encounter (Signed)
I did call and speak with patient to come back in to have the urine specimen collected, she was very angry and ugly through the whole conversation. When I tried to explain why the urine was not ordered on the day of her visit she cut me off and told me about her husbands office visit. Patient will do the urine at her lab visit and I did order it after repeatedly being told to do so.

## 2015-09-18 ENCOUNTER — Encounter: Payer: Self-pay | Admitting: Family Medicine

## 2015-09-18 NOTE — Telephone Encounter (Signed)
Letter printed and dismissal form attached, is on counter for PCP to sign. Once compete will give to Glass blower/designer to take care of mailing.

## 2015-09-21 ENCOUNTER — Telehealth: Payer: Self-pay | Admitting: Family Medicine

## 2015-09-21 NOTE — Telephone Encounter (Signed)
Patient dismissed from [facility] by Penni Homans MD , effective September 18, 2015. Dismissal letter sent out by certified / registered mail.  DAJ  Received signed domestic return receipt verifying delivery of certified letter on September 25, 2015. Article number [id number] [place]

## 2015-09-24 ENCOUNTER — Encounter: Payer: Self-pay | Admitting: Medical

## 2015-09-24 ENCOUNTER — Ambulatory Visit (INDEPENDENT_AMBULATORY_CARE_PROVIDER_SITE_OTHER): Payer: Federal, State, Local not specified - PPO | Admitting: Medical

## 2015-09-24 VITALS — BP 124/78 | HR 69 | Temp 97.7°F | Ht 61.0 in | Wt 186.2 lb

## 2015-09-24 DIAGNOSIS — J0100 Acute maxillary sinusitis, unspecified: Secondary | ICD-10-CM

## 2015-09-24 DIAGNOSIS — J069 Acute upper respiratory infection, unspecified: Secondary | ICD-10-CM

## 2015-09-24 MED ORDER — BENZONATATE 100 MG PO CAPS: 100.0000 mg | ORAL_CAPSULE | Freq: Three times a day (TID) | ORAL | Status: AC | PRN

## 2015-09-24 MED ORDER — AMOXICILLIN 875 MG PO TABS: 875.0000 mg | ORAL_TABLET | Freq: Two times a day (BID) | ORAL | Status: AC

## 2015-09-24 MED ORDER — FLUTICASONE PROPIONATE 50 MCG/ACT NA SUSP: 2.0000 | Freq: Every day | NASAL | Status: AC

## 2015-09-24 NOTE — Patient Instructions (Addendum)
You appear to have had URI and now may have early sinus infection and possible bronchitis.  I prescribed amoxicillin antibiotic. For cough benzonatate. For nasal congestion flonase.  If you get more chest congestion then recommend chest xray.  Follow up in 7 days or as needed

## 2015-09-24 NOTE — Progress Notes (Signed)
Pre visit review using our clinic review tool, if applicable. No additional management support is needed unless otherwise documented below in the visit note. 

## 2015-09-24 NOTE — Progress Notes (Signed)
Subjective:    Patient ID: Stephanie Turner, female    DOB: January 09, 1946, 70 y.o.   MRN: [id number]  HPI  Pt in with recent nasal and chest congestion. Pt had symptoms for one week. Pt has coughed up some mucous yesterday and blowing nose nose gets mucous. Some sinus pressure. Pt has concern that she will get bronchitis. She states each year will get bronchitis easily. Pt started sneezing early on.  Pt states last time seen got antibiotic   Review of Systems  Constitutional: Negative for fever and chills.  HENT: Positive for congestion and sinus pressure. Negative for ear pain, postnasal drip and sore throat.   Respiratory: Positive for cough. Negative for choking, chest tightness, shortness of breath and wheezing.        Pt feels chest congestion.  Cardiovascular: Negative for chest pain and palpitations.  Gastrointestinal: Negative for abdominal pain.  Musculoskeletal: Negative for back pain.  Skin: Negative for rash.  Neurological: Negative for dizziness and headaches.  Hematological: Negative for adenopathy. Does not bruise/bleed easily.  Psychiatric/Behavioral: Negative for behavioral problems and confusion.   Past Medical History  Diagnosis Date  . Palpitations   . Abnormal cervical Papanicolaou smear   . Hyperparathyroidism   . Cataract   . Osteoporosis   . History of chicken pox   . H/O measles   . H/O mumps   . Hyperlipidemia     Social History   Social History  . Marital Status: Married    Spouse Name: N/A  . Number of Children: N/A  . Years of Education: N/A   Occupational History  . Not on file.   Social History Main Topics  . Smoking status: Former Smoker    Quit date: 07/11/1984  . Smokeless tobacco: Never Used  . Alcohol Use: No  . Drug Use: No  . Sexual Activity: Yes    Birth Control/ Protection: Post-menopausal     Comment: lives with husband, retired from Scientist, research (life sciences) estate. office work, preschool.  minimal meat   Other Topics Concern  . Not on file     Social History Narrative    Past Surgical History  Procedure Laterality Date  . Hernia repair  1997  . Parathyroid exploration      removed an over active nodule in 1998  . Eye surgery      b/l cataracts, implants with Dr Dolores Lory  . Tonsillectomy    . Cervical biopsy  w/ loop electrode excision      at age 46 normal since    Family History  Problem Relation Age of Onset  . Cancer Mother     leukemia  . Varicose Veins Mother   . Other Father     benign brain tumor  . Endometriosis Sister   . Obesity Brother   . Ulcers Daughter   . Other Son     TBI, h/o vertigo  . Heart disease Paternal Aunt   . Cancer Paternal Grandfather     stomach  . Arthritis Sister   . Obesity Sister   . Cancer Daughter     precancerous cervical changes  . Kidney disease Daughter     surgery as a child to correct ureteral concerns    No Known Allergies  Current Outpatient Prescriptions on File Prior to Visit  Medication Sig Dispense Refill  . aspirin 81 MG tablet Take 81 mg by mouth daily.    Marland Kitchen atenolol (TENORMIN) 50 MG tablet Take 1 tablet (50 mg total) by mouth  daily. 90 tablet 1  . atenolol (TENORMIN) 50 MG tablet TAKE 1 TABLET (50 MG TOTAL) BY MOUTH DAILY. 90 tablet 0  . fish oil-omega-3 fatty acids 1000 MG capsule Take 1 capsule by mouth daily.    . Flaxseed, Linseed, (FLAX SEED OIL PO) Take by mouth daily.    Marland Kitchen GARLIC PO Take 1 tablet by mouth daily.    . Multiple Vitamins-Minerals (WOMENS 50+ MULTI VITAMIN/MIN PO) Take by mouth daily.     No current facility-administered medications on file prior to visit.    BP 124/78 mmHg  Pulse 69  Temp(Src) 97.7 F (36.5 C) (Oral)  Ht 5\' 1"  (1.549 m)  Wt 186 lb 3.2 oz (84.46 kg)  BMI 35.20 kg/m2  SpO2 98%         Objective:   Physical Exam   General  Mental Status - Alert. General Appearance - Well groomed. Not in acute distress.  Skin Rashes- No Rashes.  HEENT Head- Normal. Ear Auditory Canal - Left- Normal. Right -  Normal.Tympanic Membrane- Left- Normal. Right- Normal. Eye Sclera/Conjunctiva- Left- Normal. Right- Normal. Nose & Sinuses Nasal Mucosa- Left-  Boggy and Congested. Right-  Boggy and  Congested.Bilateral no maxillary and no  frontal sinus pressure. Mouth & Throat Lips: Upper Lip- Normal: no dryness, cracking, pallor, cyanosis, or vesicular eruption. Lower Lip-Normal: no dryness, cracking, pallor, cyanosis or vesicular eruption. Buccal Mucosa- Bilateral- No Aphthous ulcers. Oropharynx- No Discharge or Erythema. Tonsils: Characteristics- Bilateral- No Erythema or Congestion. Size/Enlargement- Bilateral- No enlargement. Discharge- bilateral-None.  Neck Neck- Supple. No Masses.   Chest and Lung Exam Auscultation: Breath Sounds:-Clear even and unlabored.  Cardiovascular Auscultation:Rythm- Regular, rate and rhythm. Murmurs & Other Heart Sounds:Ausculatation of the heart reveal- No Murmurs.  Lymphatic Head & Neck General Head & Neck Lymphatics: Bilateral: Description- No Localized lymphadenopathy.      Assessment & Plan:  You appear to have had URI and now may have early sinus infection and possible bronchitis.  I prescribed amoxicillin antibiotic. For cough benzonatate. For nasal congestion flonase.  If you get more chest congestion then recommend chest xray.  Follow up in 7 days or as needed

## 2015-09-25 ENCOUNTER — Telehealth: Payer: Self-pay

## 2015-09-25 NOTE — Telephone Encounter (Signed)
Patient and her spouse called upset that she was dismissed and wanted to know what condition does she have that she needs to seek medical attention. She said the letter stated her condition required immediate medical attention and she was not aware that she had a condition. Wanted to know why she was dismissed patient was very rude and over talked me throughout the conversation. When I was able to get a word in I told her that due to loss in therapeutic relationship. I told her we would be available for acute appointments for the time frame listed in the letter and she said we could also dismiss her husband because they were both displeased with the service and the way they have been treated in this office. Spouse was trying to rationalize with the patient and told her she really needed to ask what condition she was having so she knew what to follow up with a new doctor about. She said that she thought the doctor was trying to scare her and that she thought she was dying from the way the letter read. She said she does not want a doctor like that anyway because that is insane. She said she will be contacting an attorney because she has never heard of any doctor dismissing a patient for having questions about a urine test and to be notified through a letter they have a condition. And she patient hung up the phone.

## 2015-09-25 NOTE — Telephone Encounter (Signed)
error 

## 2015-09-28 NOTE — Telephone Encounter (Signed)
This patient was very disruptive and abrasive with staff numerous times and the decision was made to terminate her care for loss of therapeutic relationship. She received a generic letter which she has interpreted as [place] saying she has a new serious condition which is not what we are saying. Have not responded but wanted to give you guys the heads up and make sure you did not think we should do anything further since she is talking attorneys.

## 2015-10-09 ENCOUNTER — Other Ambulatory Visit: Payer: Federal, State, Local not specified - PPO

## 2015-10-16 ENCOUNTER — Encounter: Payer: Federal, State, Local not specified - PPO | Admitting: Family Medicine

## 2016-03-03 ENCOUNTER — Other Ambulatory Visit (HOSPITAL_BASED_OUTPATIENT_CLINIC_OR_DEPARTMENT_OTHER): Payer: Self-pay | Admitting: Family Medicine

## 2016-03-03 DIAGNOSIS — Z1231 Encounter for screening mammogram for malignant neoplasm of breast: Secondary | ICD-10-CM

## 2016-03-28 ENCOUNTER — Other Ambulatory Visit (HOSPITAL_BASED_OUTPATIENT_CLINIC_OR_DEPARTMENT_OTHER): Payer: Self-pay | Admitting: Family Medicine

## 2016-03-28 DIAGNOSIS — Z1231 Encounter for screening mammogram for malignant neoplasm of breast: Secondary | ICD-10-CM

## 2016-04-13 ENCOUNTER — Other Ambulatory Visit: Payer: Self-pay | Admitting: Family Medicine

## 2016-04-21 ENCOUNTER — Ambulatory Visit (HOSPITAL_BASED_OUTPATIENT_CLINIC_OR_DEPARTMENT_OTHER)
Admission: RE | Admit: 2016-04-21 | Discharge: 2016-04-21 | Disposition: A | Discharge status: Home / Self Care | Payer: Federal, State, Local not specified - PPO | Source: Ambulatory Visit | Attending: Family Medicine | Admitting: Family Medicine

## 2016-04-21 DIAGNOSIS — Z1231 Encounter for screening mammogram for malignant neoplasm of breast: Secondary | ICD-10-CM

## 2017-03-15 ENCOUNTER — Other Ambulatory Visit (HOSPITAL_BASED_OUTPATIENT_CLINIC_OR_DEPARTMENT_OTHER): Payer: Self-pay | Admitting: Obstetrics and Gynecology

## 2017-03-15 DIAGNOSIS — Z1239 Encounter for other screening for malignant neoplasm of breast: Secondary | ICD-10-CM

## 2017-04-20 ENCOUNTER — Encounter (HOSPITAL_BASED_OUTPATIENT_CLINIC_OR_DEPARTMENT_OTHER): Payer: Self-pay

## 2017-04-20 ENCOUNTER — Ambulatory Visit (HOSPITAL_BASED_OUTPATIENT_CLINIC_OR_DEPARTMENT_OTHER)
Admission: RE | Admit: 2017-04-20 | Discharge: 2017-04-20 | Disposition: A | Discharge status: Home / Self Care | Payer: Federal, State, Local not specified - PPO | Source: Ambulatory Visit | Attending: Obstetrics and Gynecology | Admitting: Obstetrics and Gynecology

## 2017-04-20 DIAGNOSIS — Z1231 Encounter for screening mammogram for malignant neoplasm of breast: Secondary | ICD-10-CM | POA: Diagnosis not present

## 2017-04-20 DIAGNOSIS — Z1239 Encounter for other screening for malignant neoplasm of breast: Secondary | ICD-10-CM

## 2018-03-13 ENCOUNTER — Other Ambulatory Visit (HOSPITAL_BASED_OUTPATIENT_CLINIC_OR_DEPARTMENT_OTHER): Payer: Self-pay | Admitting: Obstetrics and Gynecology

## 2018-03-13 DIAGNOSIS — Z1231 Encounter for screening mammogram for malignant neoplasm of breast: Secondary | ICD-10-CM

## 2018-04-23 ENCOUNTER — Ambulatory Visit (HOSPITAL_BASED_OUTPATIENT_CLINIC_OR_DEPARTMENT_OTHER)
Admission: RE | Admit: 2018-04-23 | Discharge: 2018-04-23 | Disposition: A | Discharge status: Home / Self Care | Payer: Federal, State, Local not specified - PPO | Source: Ambulatory Visit | Attending: Obstetrics and Gynecology | Admitting: Obstetrics and Gynecology

## 2018-04-23 DIAGNOSIS — Z1231 Encounter for screening mammogram for malignant neoplasm of breast: Secondary | ICD-10-CM | POA: Diagnosis present

## 2019-03-02 ENCOUNTER — Other Ambulatory Visit (HOSPITAL_BASED_OUTPATIENT_CLINIC_OR_DEPARTMENT_OTHER): Payer: Self-pay | Admitting: Physician Assistant

## 2019-03-02 DIAGNOSIS — Z1231 Encounter for screening mammogram for malignant neoplasm of breast: Secondary | ICD-10-CM

## 2019-04-24 ENCOUNTER — Ambulatory Visit (HOSPITAL_BASED_OUTPATIENT_CLINIC_OR_DEPARTMENT_OTHER)
Admission: RE | Admit: 2019-04-24 | Discharge: 2019-04-24 | Disposition: A | Discharge status: Home / Self Care | Payer: Federal, State, Local not specified - PPO | Source: Ambulatory Visit | Attending: Physician Assistant | Admitting: Physician Assistant

## 2019-04-24 ENCOUNTER — Other Ambulatory Visit: Payer: Self-pay

## 2019-04-24 DIAGNOSIS — Z1231 Encounter for screening mammogram for malignant neoplasm of breast: Secondary | ICD-10-CM | POA: Insufficient documentation

## 2020-02-26 ENCOUNTER — Other Ambulatory Visit (HOSPITAL_BASED_OUTPATIENT_CLINIC_OR_DEPARTMENT_OTHER): Payer: Self-pay | Admitting: Internal Medicine

## 2020-02-26 DIAGNOSIS — Z1231 Encounter for screening mammogram for malignant neoplasm of breast: Secondary | ICD-10-CM

## 2020-04-27 ENCOUNTER — Other Ambulatory Visit: Payer: Self-pay

## 2020-04-27 ENCOUNTER — Ambulatory Visit (HOSPITAL_BASED_OUTPATIENT_CLINIC_OR_DEPARTMENT_OTHER)
Admission: RE | Admit: 2020-04-27 | Discharge: 2020-04-27 | Disposition: A | Discharge status: Home / Self Care | Payer: Federal, State, Local not specified - PPO | Source: Ambulatory Visit | Attending: Internal Medicine | Admitting: Internal Medicine

## 2020-04-27 DIAGNOSIS — Z1231 Encounter for screening mammogram for malignant neoplasm of breast: Secondary | ICD-10-CM | POA: Diagnosis not present

## 2021-01-04 LAB — COLOGUARD: COLOGUARD: NEGATIVE

## 2021-02-10 ENCOUNTER — Other Ambulatory Visit (HOSPITAL_BASED_OUTPATIENT_CLINIC_OR_DEPARTMENT_OTHER): Payer: Self-pay | Admitting: Physician Assistant

## 2021-02-10 DIAGNOSIS — Z1231 Encounter for screening mammogram for malignant neoplasm of breast: Secondary | ICD-10-CM

## 2021-05-03 ENCOUNTER — Ambulatory Visit (HOSPITAL_BASED_OUTPATIENT_CLINIC_OR_DEPARTMENT_OTHER)
Admission: RE | Admit: 2021-05-03 | Discharge: 2021-05-03 | Disposition: A | Discharge status: Home / Self Care | Payer: Federal, State, Local not specified - PPO | Source: Ambulatory Visit | Attending: Physician Assistant | Admitting: Physician Assistant

## 2021-05-03 ENCOUNTER — Other Ambulatory Visit: Payer: Self-pay

## 2021-05-03 ENCOUNTER — Encounter (HOSPITAL_BASED_OUTPATIENT_CLINIC_OR_DEPARTMENT_OTHER): Payer: Self-pay

## 2021-05-03 DIAGNOSIS — Z1231 Encounter for screening mammogram for malignant neoplasm of breast: Secondary | ICD-10-CM | POA: Insufficient documentation

## 2022-01-31 ENCOUNTER — Other Ambulatory Visit (HOSPITAL_BASED_OUTPATIENT_CLINIC_OR_DEPARTMENT_OTHER): Payer: Self-pay | Admitting: Physician Assistant

## 2022-01-31 DIAGNOSIS — Z1231 Encounter for screening mammogram for malignant neoplasm of breast: Secondary | ICD-10-CM

## 2022-05-04 ENCOUNTER — Ambulatory Visit (HOSPITAL_BASED_OUTPATIENT_CLINIC_OR_DEPARTMENT_OTHER)
Admission: RE | Admit: 2022-05-04 | Discharge: 2022-05-04 | Disposition: A | Discharge status: Home / Self Care | Payer: Federal, State, Local not specified - PPO | Source: Ambulatory Visit | Attending: Physician Assistant | Admitting: Physician Assistant

## 2022-05-04 ENCOUNTER — Encounter (HOSPITAL_BASED_OUTPATIENT_CLINIC_OR_DEPARTMENT_OTHER): Payer: Self-pay

## 2022-05-04 DIAGNOSIS — Z1231 Encounter for screening mammogram for malignant neoplasm of breast: Secondary | ICD-10-CM | POA: Insufficient documentation

## 2022-05-10 ENCOUNTER — Ambulatory Visit (HOSPITAL_BASED_OUTPATIENT_CLINIC_OR_DEPARTMENT_OTHER): Payer: Federal, State, Local not specified - PPO

## 2022-05-24 ENCOUNTER — Encounter: Payer: Self-pay | Admitting: General Practice

## 2022-07-18 ENCOUNTER — Encounter: Payer: Federal, State, Local not specified - PPO | Admitting: Obstetrics and Gynecology

## 2022-08-26 IMAGING — MG MM DIGITAL SCREENING BILAT W/ TOMO AND CAD
8 series · 8 of 24 positions shown · non-contrast
Comparison: Previous exam(s).

ACR Breast Density Category a: The breast tissue is almost entirely
fatty.

CLINICAL DATA: Screening.

EXAM:
DIGITAL SCREENING BILATERAL MAMMOGRAM WITH TOMOSYNTHESIS AND CAD
TECHNIQUE: Bilateral screening digital craniocaudal and mediolateral oblique
mammograms were obtained. Bilateral screening digital breast
tomosynthesis was performed. The images were evaluated with
computer-aided detection.

[R CC synth-2D]
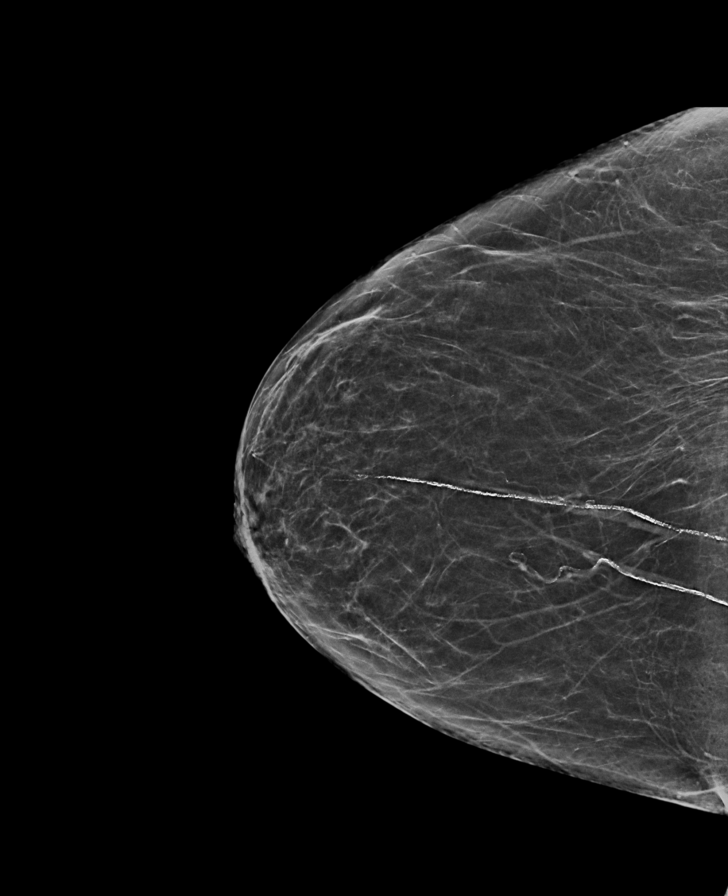

[R MLO synth-2D]
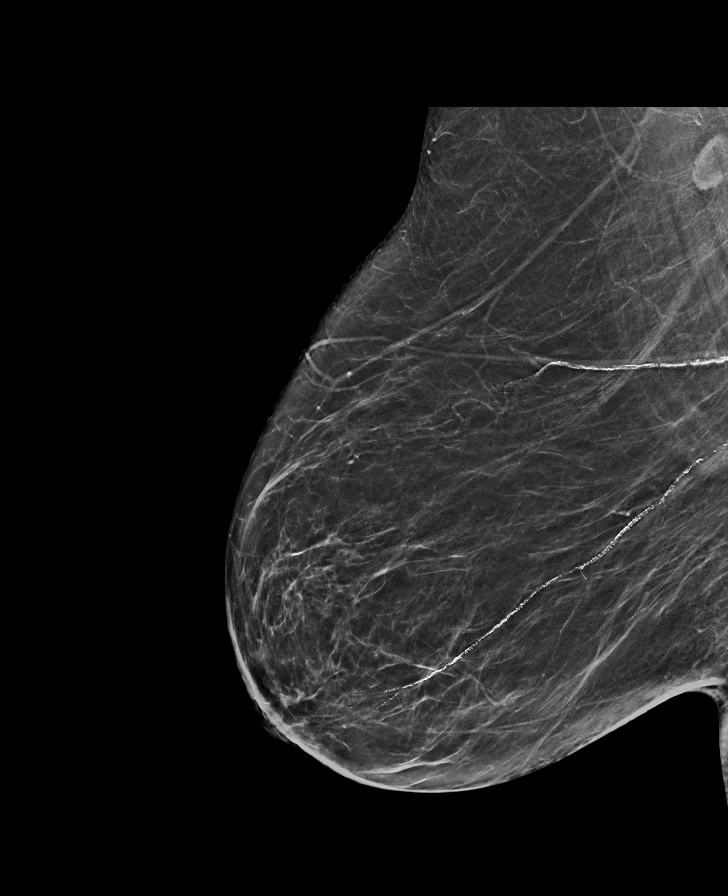

[L MLO synth-2D]
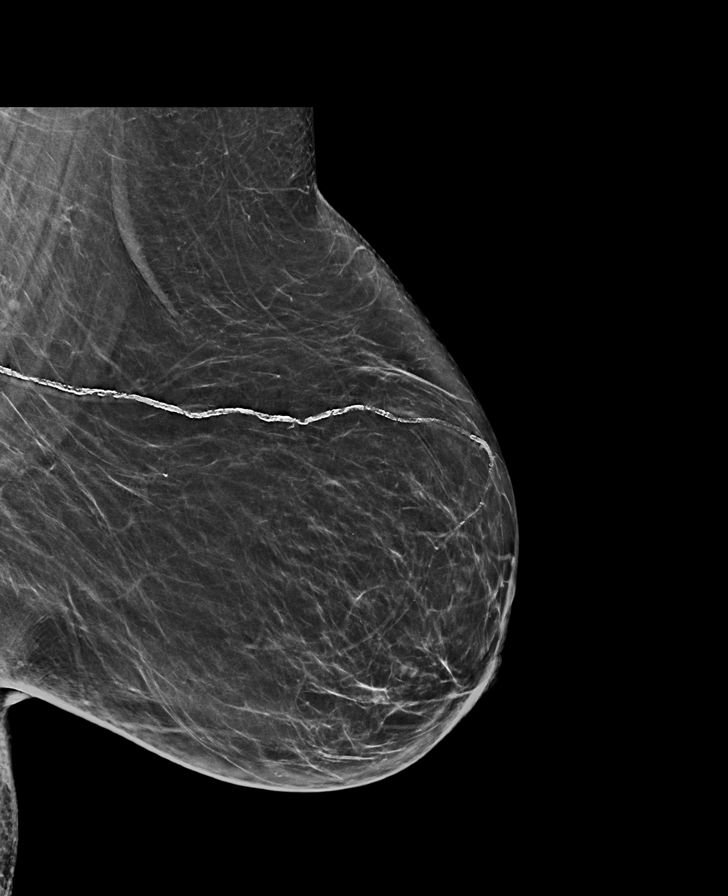

[L CC synth-2D]
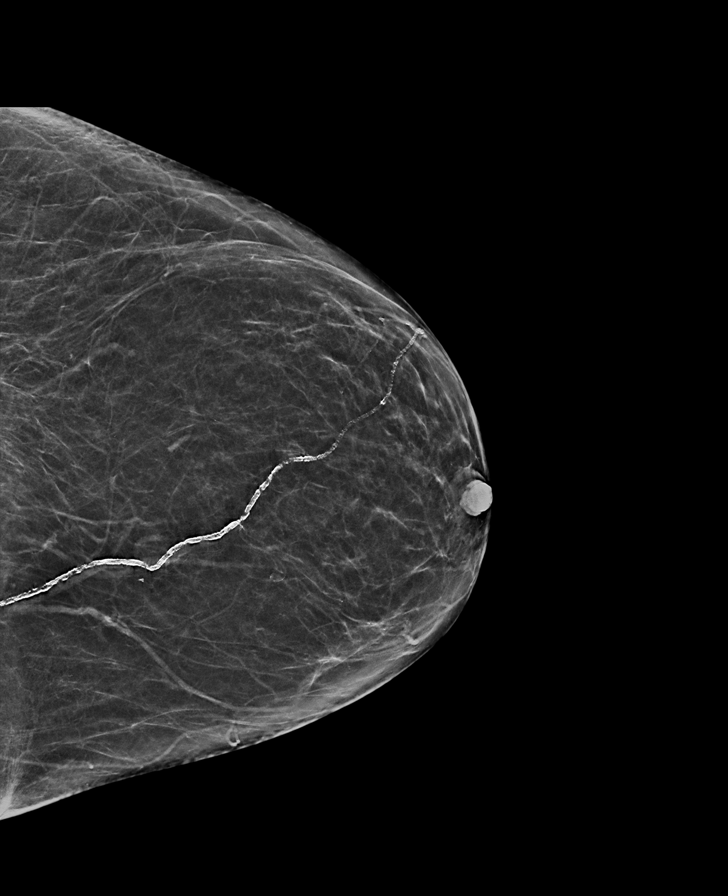

[L CC tomo · tomo slice 29/58.0]
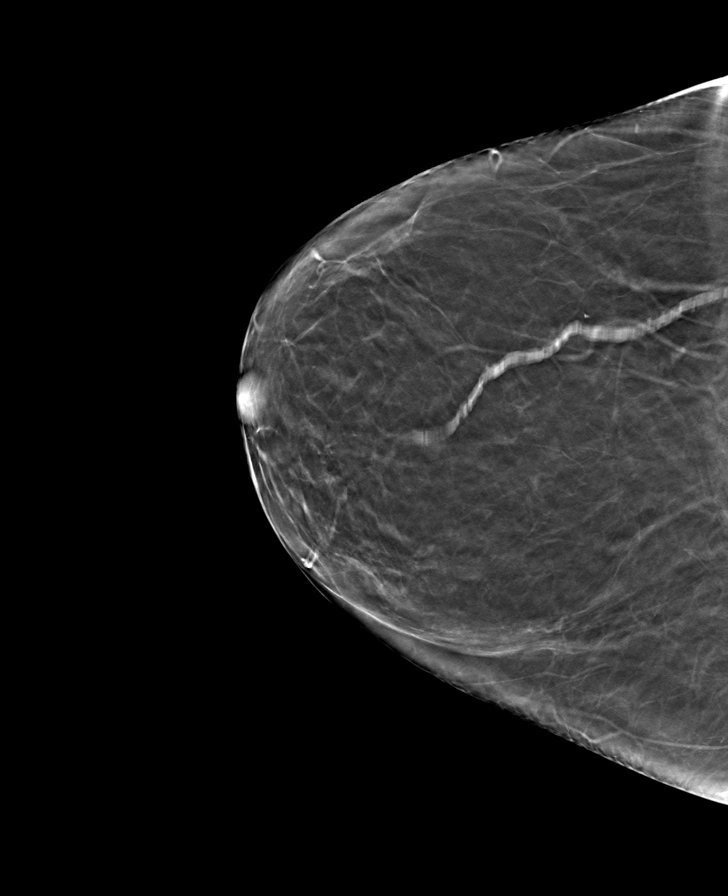

[L MLO tomo · tomo slice 33/64.0]
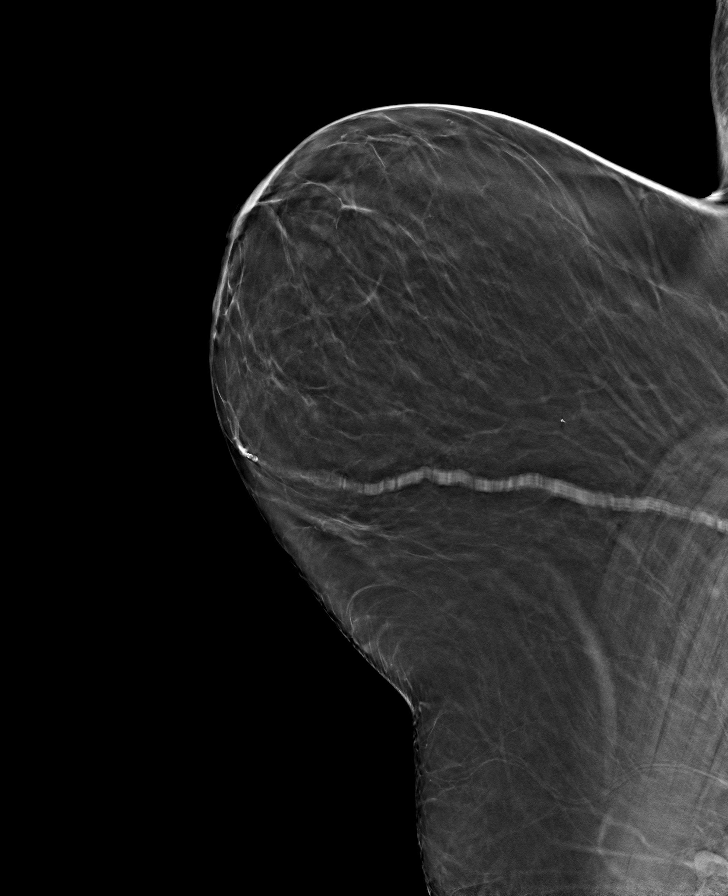

[R CC tomo · tomo slice 29/58.0]
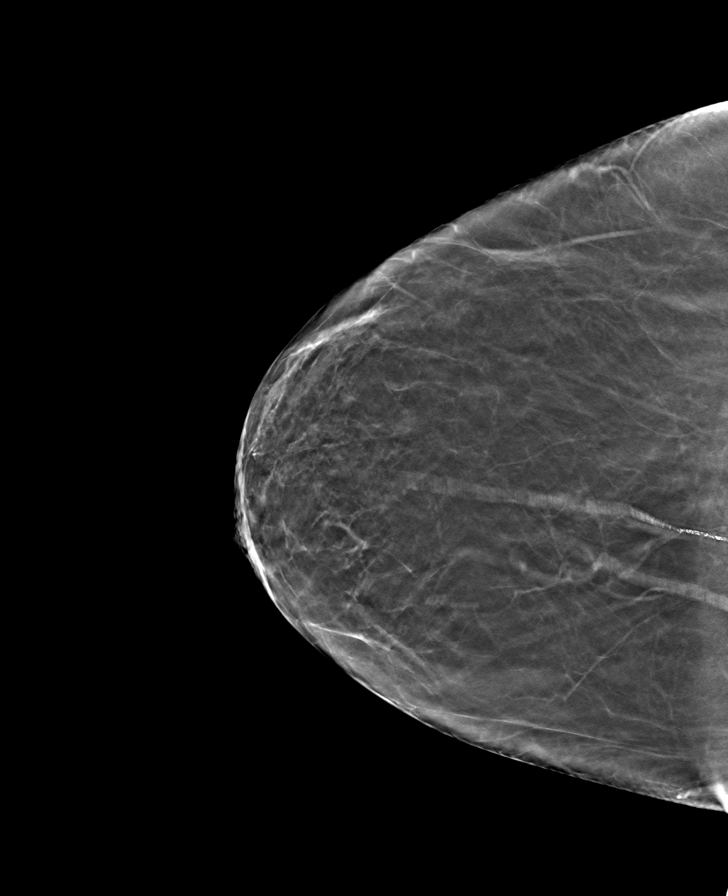

[R MLO tomo · tomo slice 32/63.0]
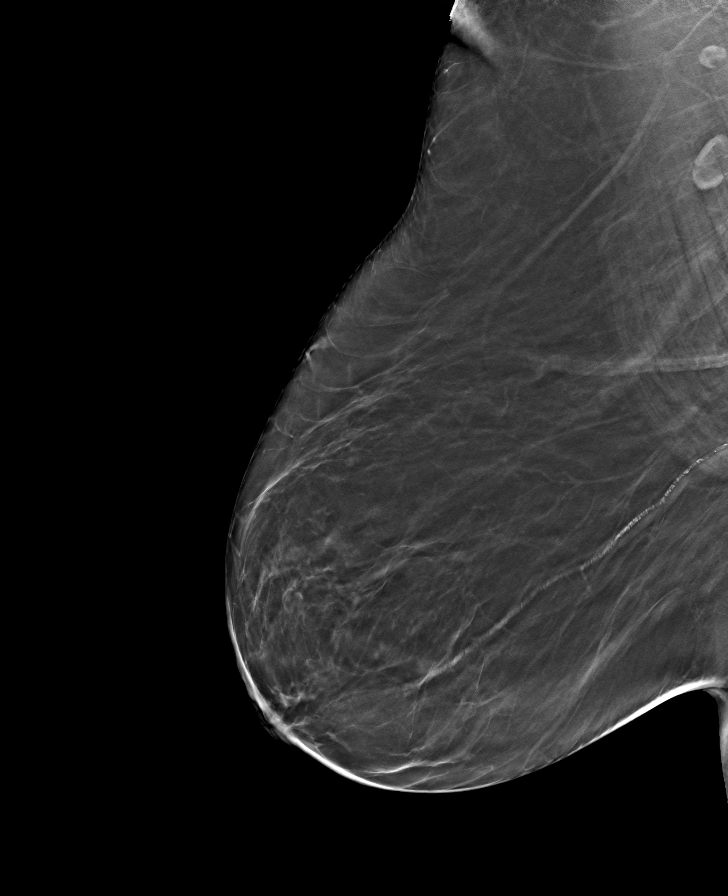

[8 of 24 positions shown; findings below may reference images not displayed]

FINDINGS: There are no findings suspicious for malignancy.
IMPRESSION: No mammographic evidence of malignancy. A result letter of this
screening mammogram will be mailed directly to the patient.

RECOMMENDATION:
Screening mammogram in one year. (Code:0E-3-N98)

BI-RADS CATEGORY  1: Negative.

## 2023-03-15 ENCOUNTER — Other Ambulatory Visit (HOSPITAL_BASED_OUTPATIENT_CLINIC_OR_DEPARTMENT_OTHER): Payer: Self-pay | Admitting: Internal Medicine

## 2023-03-15 DIAGNOSIS — Z1231 Encounter for screening mammogram for malignant neoplasm of breast: Secondary | ICD-10-CM

## 2023-05-08 ENCOUNTER — Ambulatory Visit (HOSPITAL_BASED_OUTPATIENT_CLINIC_OR_DEPARTMENT_OTHER)
Admission: RE | Admit: 2023-05-08 | Discharge: 2023-05-08 | Disposition: A | Discharge status: Home / Self Care | Payer: Federal, State, Local not specified - PPO | Source: Ambulatory Visit | Attending: Internal Medicine | Admitting: Internal Medicine

## 2023-05-08 ENCOUNTER — Encounter (HOSPITAL_BASED_OUTPATIENT_CLINIC_OR_DEPARTMENT_OTHER): Payer: Self-pay

## 2023-05-08 DIAGNOSIS — Z1231 Encounter for screening mammogram for malignant neoplasm of breast: Secondary | ICD-10-CM | POA: Insufficient documentation

## 2023-12-23 LAB — COLOGUARD

## 2024-02-28 ENCOUNTER — Other Ambulatory Visit (HOSPITAL_BASED_OUTPATIENT_CLINIC_OR_DEPARTMENT_OTHER): Payer: Self-pay | Admitting: Physician Assistant

## 2024-02-28 ENCOUNTER — Encounter (HOSPITAL_BASED_OUTPATIENT_CLINIC_OR_DEPARTMENT_OTHER): Payer: Self-pay | Admitting: Emergency Medicine

## 2024-02-28 DIAGNOSIS — Z1231 Encounter for screening mammogram for malignant neoplasm of breast: Secondary | ICD-10-CM

## 2024-04-29 ENCOUNTER — Telehealth (HOSPITAL_BASED_OUTPATIENT_CLINIC_OR_DEPARTMENT_OTHER): Payer: Self-pay

## 2024-05-02 ENCOUNTER — Ambulatory Visit (HOSPITAL_BASED_OUTPATIENT_CLINIC_OR_DEPARTMENT_OTHER)
Admission: RE | Admit: 2024-05-02 | Discharge: 2024-05-02 | Disposition: A | Discharge status: Home / Self Care | Source: Ambulatory Visit | Attending: Physician Assistant | Admitting: Physician Assistant

## 2024-05-02 ENCOUNTER — Encounter (HOSPITAL_BASED_OUTPATIENT_CLINIC_OR_DEPARTMENT_OTHER): Payer: Self-pay

## 2024-05-02 DIAGNOSIS — Z1231 Encounter for screening mammogram for malignant neoplasm of breast: Secondary | ICD-10-CM | POA: Diagnosis present

## 2024-05-08 ENCOUNTER — Ambulatory Visit (HOSPITAL_BASED_OUTPATIENT_CLINIC_OR_DEPARTMENT_OTHER)
